# Patient Record
Sex: Female | Born: 1955 | Race: Black or African American | Hispanic: No | Smoking: Never smoker
Health system: Southern US, Community
[De-identification: ages and names within clinical notes are randomized; demographics above are authoritative.]

## PROBLEM LIST (undated history)

## (undated) DIAGNOSIS — K219 Gastro-esophageal reflux disease without esophagitis: Secondary | ICD-10-CM

## (undated) DIAGNOSIS — Z860100 Personal history of colon polyps, unspecified: Secondary | ICD-10-CM

## (undated) HISTORY — DX: Personal history of colon polyps, unspecified: Z86.0100

## (undated) HISTORY — PX: UPPER GASTROINTESTINAL ENDOSCOPY: SHX188

## (undated) HISTORY — DX: Personal history of colonic polyps: Z86.010

## (undated) HISTORY — DX: Gastro-esophageal reflux disease without esophagitis: K21.9

## (undated) HISTORY — PX: COLONOSCOPY: SHX174

---

## 2021-11-03 ENCOUNTER — Other Ambulatory Visit: Payer: Self-pay

## 2021-11-03 ENCOUNTER — Ambulatory Visit (HOSPITAL_BASED_OUTPATIENT_CLINIC_OR_DEPARTMENT_OTHER): Payer: BC Managed Care – PPO | Admitting: Nurse Practitioner

## 2021-11-03 ENCOUNTER — Encounter (HOSPITAL_BASED_OUTPATIENT_CLINIC_OR_DEPARTMENT_OTHER): Payer: Self-pay | Admitting: Nurse Practitioner

## 2021-11-03 VITALS — BP 117/72 | HR 98 | Ht 66.0 in | Wt 179.0 lb

## 2021-11-03 DIAGNOSIS — Z1211 Encounter for screening for malignant neoplasm of colon: Secondary | ICD-10-CM

## 2021-11-03 DIAGNOSIS — Z23 Encounter for immunization: Secondary | ICD-10-CM | POA: Diagnosis not present

## 2021-11-03 DIAGNOSIS — R143 Flatulence: Secondary | ICD-10-CM | POA: Insufficient documentation

## 2021-11-03 DIAGNOSIS — Z1329 Encounter for screening for other suspected endocrine disorder: Secondary | ICD-10-CM | POA: Diagnosis not present

## 2021-11-03 DIAGNOSIS — Z13228 Encounter for screening for other metabolic disorders: Secondary | ICD-10-CM

## 2021-11-03 DIAGNOSIS — M8580 Other specified disorders of bone density and structure, unspecified site: Secondary | ICD-10-CM

## 2021-11-03 DIAGNOSIS — K219 Gastro-esophageal reflux disease without esophagitis: Secondary | ICD-10-CM | POA: Insufficient documentation

## 2021-11-03 DIAGNOSIS — Z130 Encounter for screening for diseases of the blood and blood-forming organs and certain disorders involving the immune mechanism: Secondary | ICD-10-CM

## 2021-11-03 DIAGNOSIS — Z780 Asymptomatic menopausal state: Secondary | ICD-10-CM

## 2021-11-03 DIAGNOSIS — Z1321 Encounter for screening for nutritional disorder: Secondary | ICD-10-CM

## 2021-11-03 DIAGNOSIS — Z13820 Encounter for screening for osteoporosis: Secondary | ICD-10-CM

## 2021-11-03 DIAGNOSIS — Z1231 Encounter for screening mammogram for malignant neoplasm of breast: Secondary | ICD-10-CM

## 2021-11-03 DIAGNOSIS — Z0000 Encounter for general adult medical examination without abnormal findings: Secondary | ICD-10-CM

## 2021-11-03 NOTE — Patient Instructions (Signed)
Thank you for choosing [place] at [facility] for your Primary Care needs. I am excited for the opportunity to partner with you to meet your health care goals. It was a pleasure meeting you today!  Recommendations from today's visit: We will let you know if there are any concerns with your labs that need addressed.  I have sent orders for the DEXA scan, the mammogram, and to the GI doctor for colonoscopy and evaluation for endoscopy.  I have added the stretches for you back in this packet.   Information on diet, exercise, and health maintenance recommendations are listed below. This is information to help you be sure you are on track for optimal health and monitoring.   Please look over this and let us know if you have any questions or if you have completed any of the health maintenance outside of [place] so that we can be sure your records are up to date.  ___________________________________________________________ About Me: I am an Adult-Geriatric Nurse Practitioner with a background in caring for patients for more than 20 years with a strong intensive care background. I provide primary care and sports medicine services to patients age 2 and older within this office. My education had a strong focus on caring for the older adult population, which I am passionate about. I am also the director of the APP Fellowship with [facility].   My desire is to provide you with the best service through preventive medicine and supportive care. I consider you a part of the medical team and value your input. I work diligently to ensure that you are heard and your needs are met in a safe and effective manner. I want you to feel comfortable with me as your provider and want you to know that your health concerns are important to me.  For your information, our office hours are: Monday, Tuesday, and Thursday 8:00 AM - 5:00 PM Wednesday and Friday 8:00 AM - 12:00 PM.   In my time away  from the office I am teaching new APP's within the system and am unavailable, but my partner, Dr. Burnard Bunting is in the office for emergent needs.   If you have questions or concerns, please call our office at [phone] or send [place] a MyChart message and we will respond as quickly as possible.  ____________________________________________________________ MyChart:  For all urgent or time sensitive needs we ask that you please call the office to avoid delays. Our number is [phone]. MyChart is not constantly monitored and due to the large volume of messages a day, replies may take up to 72 business hours.  MyChart Policy: MyChart allows for you to see your visit notes, after visit summary, provider recommendations, lab and tests results, make an appointment, request refills, and contact your provider or the office for non-urgent questions or concerns. Providers are seeing patients during normal business hours and do not have built in time to review MyChart messages.  We ask that you allow a minimum of 3 business days for responses to [employer]. For this reason, please do not send urgent requests through [place]. Please call the office at [phone]. New and ongoing conditions may require a visit. We have virtual and in person visit available for your convenience.  Complex MyChart concerns may require a visit. Your provider may request you schedule a virtual or in person visit to ensure we are providing the best care possible. MyChart messages sent after 11:00 AM on Friday will not be  received by the provider until Monday morning.    Lab and Test Results: You will receive your lab and test results on MyChart as soon as they are completed and results have been sent by the lab or testing facility. Due to this service, you will receive your results BEFORE your provider.  I review lab and tests results each morning prior to seeing patients. Some results require collaboration with other providers  to ensure you are receiving the most appropriate care. For this reason, we ask that you please allow a minimum of 3-5 business days from the time the ALL results have been received for your provider to receive and review lab and test results and contact you about these.  Most lab and test result comments from the provider will be sent through [place]. Your provider may recommend changes to the plan of care, follow-up visits, repeat testing, ask questions, or request an office visit to discuss these results. You may reply directly to this message or call the office at [phone] to provide information for the provider or set up an appointment. In some instances, you will be called with test results and recommendations. Please let us know if this is preferred and we will make note of this in your chart to provide this for you.    If you have not heard a response to your lab or test results in 5 business days from all results returning to [place], please call the office to let us know. We ask that you please avoid calling prior to this time unless there is an emergent concern. Due to high call volumes, this can delay the resulting process.  After Hours: For all non-emergency after hours needs, please call the office at [phone] and select the option to reach the on-call provider service. On-call services are shared between multiple [facility] offices and therefore it will not be possible to speak directly with your provider. On-call providers may provide medical advice and recommendations, but are unable to provide refills for maintenance medications.  For all emergency or urgent medical needs after normal business hours, we recommend that you seek care at the closest Urgent Care or Emergency Department to ensure appropriate treatment in a timely manner.  [facility] has a 24 hour emergency room located on the ground floor for your convenience.   Urgent Concerns During the  Business Day Providers are seeing patients from 8AM to [place] with a busy schedule and are most often not able to respond to non-urgent calls until the end of the day or the next business day. If you should have URGENT concerns during the day, please call and speak to the nurse or schedule a same day appointment so that we can address your concern without delay.   Thank you, again, for choosing me as your health care partner. I appreciate your trust and look forward to learning more about you.   Worthy Keeler, DNP, AGNP-c ___________________________________________________________  Health Maintenance Recommendations Screening Testing Mammogram Every 1 -2 years based on history and risk factors Starting at age 59 Pap Smear Ages 21-39 every 3 years Ages 71-65 every 5 years with HPV testing More frequent testing may be required based on results and history Colon Cancer Screening Every 1-10 years based on test performed, risk factors, and history Starting at age 49 Bone Density Screening Every 2-10 years based on history Starting at age 66 for women Recommendations for men differ based on medication usage, history, and risk factors AAA  Screening One time ultrasound Men 74-34 years old who have every smoked Lung Cancer Screening Low Dose Lung CT every 12 months Age 60-80 years with a 30 pack-year smoking history who still smoke or who have quit within the last 15 years  Screening Labs Routine  Labs: Complete Blood Count (CBC), Complete Metabolic Panel (CMP), Cholesterol (Lipid Panel) Every 6-12 months based on history and medications May be recommended more frequently based on current conditions or previous results Hemoglobin A1c Lab Every 3-12 months based on history and previous results Starting at age 10 or earlier with diagnosis of diabetes, high cholesterol, BMI >26, and/or risk factors Frequent monitoring for patients with diabetes to ensure blood sugar control Thyroid Panel  (TSH w/ T3 & T4) Every 6 months based on history, symptoms, and risk factors May be repeated more often if on medication HIV One time testing for all patients 86 and older May be repeated more frequently for patients with increased risk factors or exposure Hepatitis C One time testing for all patients 65 and older May be repeated more frequently for patients with increased risk factors or exposure Gonorrhea, Chlamydia Every 12 months for all sexually active persons 13-24 years Additional monitoring may be recommended for those who are considered high risk or who have symptoms PSA Men 46-53 years old with risk factors Additional screening may be recommended from age 79-69 based on risk factors, symptoms, and history  Vaccine Recommendations Tetanus Booster All adults every 10 years Flu Vaccine All patients 6 months and older every year COVID Vaccine All patients 12 years and older Initial dosing with booster May recommend additional booster based on age and health history HPV Vaccine 2 doses all patients age 13-26 Dosing may be considered for patients over 26 Shingles Vaccine (Shingrix) 2 doses all adults 18 years and older Pneumonia (Pneumovax 23) All adults 73 years and older May recommend earlier dosing based on health history Pneumonia (Prevnar 26) All adults 67 years and older Dosed 1 year after Pneumovax 23  Additional Screening, Testing, and Vaccinations may be recommended on an individualized basis based on family history, health history, risk factors, and/or exposure.  __________________________________________________________  Diet Recommendations for All Patients  I recommend that all patients maintain a diet low in saturated fats, carbohydrates, and cholesterol. While this can be challenging at first, it is not impossible and small changes can make big differences.  Things to try: Decreasing the amount of soda, sweet tea, and/or juice to one or less per day and  replace with water While water is always the first choice, if you do not like water you may consider adding a water additive without sugar to improve the taste other sugar free drinks Replace potatoes with a brightly colored vegetable at dinner Use healthy oils, such as canola oil or olive oil, instead of butter or hard margarine Limit your bread intake to two pieces or less a day Replace regular pasta with low carb pasta options Bake, broil, or grill foods instead of frying Monitor portion sizes  Eat smaller, more frequent meals throughout the day instead of large meals  An important thing to remember is, if you love foods that are not great for your health, you don't have to give them up completely. Instead, allow these foods to be a reward when you have done well. Allowing yourself to still have special treats every once in a while is a nice way to tell yourself thank you for working hard to keep yourself healthy.  Also remember that every day is a new day. If you have a bad day and "fall off the wagon", you can still climb right back up and keep moving along on your journey!  We have resources available to help you!  Some websites that may be helpful include: www.http://carter.biz/  Www.VeryWellFit.com _____________________________________________________________  Activity Recommendations for All Patients  I recommend that all adults get at least 20 minutes of moderate physical activity that elevates your heart rate at least 5 days out of the week.  Some examples include: Walking or jogging at a pace that allows you to carry on a conversation Cycling (stationary bike or outdoors) Water aerobics Yoga Weight lifting Dancing If physical limitations prevent you from putting stress on your joints, exercise in a pool or seated in a chair are excellent options.  Do determine your MAXIMUM heart rate for activity: YOUR AGE - 220 = MAX HeartRate   Remember! Do not push yourself too hard.   Start slowly and build up your pace, speed, weight, time in exercise, etc.  Allow your body to rest between exercise and get good sleep. You will need more water than normal when you are exerting yourself. Do not wait until you are thirsty to drink. Drink with a purpose of getting in at least 8, 8 ounce glasses of water a day plus more depending on how much you exercise and sweat.    If you begin to develop dizziness, chest pain, abdominal pain, jaw pain, shortness of breath, headache, vision changes, lightheadedness, or other concerning symptoms, stop the activity and allow your body to rest. If your symptoms are severe, seek emergency evaluation immediately. If your symptoms are concerning, but not severe, please let us know so that we can recommend further evaluation.

## 2021-11-03 NOTE — Assessment & Plan Note (Signed)
Etiology unknown. She is due for colonoscopy.  Will send referral to GI.  Suspect that increased GERD may be contributing.  Discussion on diet and handout provided.  No alarm symptoms present.

## 2021-11-03 NOTE — Assessment & Plan Note (Signed)
Will send referral to GI for evaluation. She is in need of colonoscopy and would like to get an endoscopy at the same time, if possible.  Recommendations for diet and food elimination discussed and provided on handout.  She will let me know if natural options are not effective.

## 2021-11-03 NOTE — Progress Notes (Signed)
Orma Render, DNP, AGNP-c Primary Care & Sports Medicine [address]   [place], [place] [address] [phone]  New patient visit   Patient: Tina Harris   DOB: January 28, 1956   65 y.o. Female  MRN: [id number] Visit Date: 11/03/2021  Patient Care Team: Etola Mull, Coralee Pesa, NP as PCP - General (Nurse Practitioner)  Today's healthcare provider: Orma Render, NP   Chief Complaint  Patient presents with   New Patient (Initial Visit)    Patient presents to establish care. She is new to the area. She moved from [place]. Patient stated she would like labs done today. Her concern is GERD, she has been very gassy the past two months. She would also like to have a mammogram and colonoscopy as well. Patient denied flu shot. She would like tetanus shot today.    Subjective    HPI HPI     New Patient (Initial Visit)    Additional comments: Patient presents to establish care. She is new to the area. She moved from [place]. Patient stated she would like labs done today. Her concern is GERD, she has been very gassy the past two months. She would also like to have a mammogram and colonoscopy as well. Patient denied flu shot. She would like tetanus shot today.       Last edited by Pennelope Bracken on 11/03/2021  1:32 PM.      Niquita Digioia is a 65 y.o. female who presents today as a new patient to establish care.    Jesseka has concerns today with increased reflux and gas symptoms that have been present and she feels are worsening. She tells me in the past she did take Nexium for an extended period and she feels this has contributed to her bone density loss. She would like to avoid medications if at all possible. She reports that she does tend to eat in the evenings. She avoids carbonation. She does enjoy chocolate.  She reports her BM are soft and formed with no concerns for dark stool.  She denies noticeable bloating. She eats a healthy diet with  no dairy and high in fruits and vegetables. She drinks at least 64 ounces of water a day. She is active walking at least 2 miles a day.   She also reports intermittent pain in her lower left back only when she has been active. She reports the pain is dull and comes on quickly after activity then slowly resolves. She has no urinary symptoms. No fever, chills. No known injury to the area.   She moved to [place] to be closer to family in Apr 17, 2023. Her husband passed away in 17-Oct-2020 from [place].   History reviewed. No pertinent past medical history. History reviewed. No pertinent surgical history. Family Status  Relation Name Status   Mother  Alive   Father  Alive   History reviewed. No pertinent family history. Social History   Socioeconomic History   Marital status: Widowed    Spouse name: Not on file   Number of children: Not on file   Years of education: Not on file   Highest education level: Not on file  Occupational History   Not on file  Tobacco Use   Smoking status: Never   Smokeless tobacco: Never  Vaping Use   Vaping Use: Not on file  Substance and Sexual Activity   Alcohol use: Yes    Comment: rarely   Drug use: Never  Sexual activity: Not Currently  Other Topics Concern   Not on file  Social History Narrative   Not on file   Social Determinants of Health   Financial Resource Strain: Not on file  Food Insecurity: Not on file  Transportation Needs: Not on file  Physical Activity: Not on file  Stress: Not on file  Social Connections: Not on file   No outpatient medications prior to visit.   No facility-administered medications prior to visit.   No Known Allergies  Immunization History  Administered Date(s) Administered   Td 11/03/2021    Health Maintenance  Topic Date Due   HIV Screening  Never done   Hepatitis C Screening  Never done   PAP SMEAR-Modifier  Never done   COLONOSCOPY (Pts 45-27yrs Insurance coverage will need to be confirmed)   Never done   MAMMOGRAM  Never done   Zoster Vaccines- Shingrix (1 of 2) Never done   INFLUENZA VACCINE  11/03/2022 (Originally 06/13/2021)   TETANUS/TDAP  11/04/2031   Pneumococcal Vaccine 76-65 Years old  Aged Out   HPV VACCINES  Aged Out    Patient Care Team: Sloan Galentine, Coralee Pesa, NP as PCP - General (Nurse Practitioner)  Review of Systems All review of systems negative except what is listed in the HPI   Objective    BP 117/72    Pulse 98    Ht 5\' 6"  (1.676 m)    Wt 179 lb (81.2 kg)    SpO2 96%    BMI 28.89 kg/m  Physical Exam Vitals and nursing note reviewed.  Constitutional:      General: She is not in acute distress.    Appearance: Normal appearance.  Eyes:     Extraocular Movements: Extraocular movements intact.     Conjunctiva/sclera: Conjunctivae normal.     Pupils: Pupils are equal, round, and reactive to light.  Neck:     Vascular: No carotid bruit.  Cardiovascular:     Rate and Rhythm: Normal rate and regular rhythm.     Pulses: Normal pulses.     Heart sounds: Normal heart sounds. No murmur heard. Pulmonary:     Effort: Pulmonary effort is normal.     Breath sounds: Normal breath sounds. No wheezing.  Abdominal:     General: Bowel sounds are normal.     Palpations: Abdomen is soft.  Musculoskeletal:        General: Normal range of motion.     Cervical back: Normal range of motion.     Right lower leg: No edema.     Left lower leg: No edema.  Skin:    General: Skin is warm and dry.     Capillary Refill: Capillary refill takes less than 2 seconds.  Neurological:     General: No focal deficit present.     Mental Status: She is alert and oriented to person, place, and time.  Psychiatric:        Mood and Affect: Mood normal.        Behavior: Behavior normal.        Thought Content: Thought content normal.        Judgment: Judgment normal.    Depression Screen PHQ 2/9 Scores 11/03/2021  PHQ - 2 Score 0   No results found for any visits on 11/03/21.   Assessment & Plan      Problem List Items Addressed This Visit     Gastroesophageal reflux disease    Will send referral to GI for  evaluation. She is in need of colonoscopy and would like to get an endoscopy at the same time, if possible.  Recommendations for diet and food elimination discussed and provided on handout.  She will let me know if natural options are not effective.       Relevant Orders   CBC with Differential/Platelet   Comprehensive metabolic panel   Lipid panel   Hemoglobin A1c   TSH   VITAMIN D 25 Hydroxy (Vit-D Deficiency, Fractures)   T4   T3   Ambulatory referral to Gastroenterology   Excessive flatus    Etiology unknown. She is due for colonoscopy.  Will send referral to GI.  Suspect that increased GERD may be contributing.  Discussion on diet and handout provided.  No alarm symptoms present.       Relevant Orders   Ambulatory referral to Gastroenterology   Other Visit Diagnoses     Encounter for immunization    -  Primary   Relevant Orders   Td : Tetanus/diphtheria >7yo Preservative  free (Completed)   Encounter for medical examination to establish care       Screening for endocrine, nutritional, metabolic and immunity disorder       Relevant Orders   CBC with Differential/Platelet   Comprehensive metabolic panel   Lipid panel   Hemoglobin A1c   TSH   VITAMIN D 25 Hydroxy (Vit-D Deficiency, Fractures)   T4   T3   Screening mammogram for breast cancer       Relevant Orders   MM Digital Screening   Screening for osteoporosis       Relevant Orders   DG Bone Density   Screening for colon cancer       Relevant Orders   Ambulatory referral to Gastroenterology   Osteopenia after menopause       Relevant Orders   DG Bone Density        In next few months for CPE     Laken Rog, Coralee Pesa, NP, DNP, AGNP-C Primary Care & Sports Medicine at [facility]

## 2021-11-04 LAB — COMPREHENSIVE METABOLIC PANEL
ALT: 11 IU/L (ref 0–32)
AST: 20 IU/L (ref 0–40)
Albumin/Globulin Ratio: 1.7 (ref 1.2–2.2)
Albumin: 4.8 g/dL (ref 3.8–4.8)
Alkaline Phosphatase: 86 IU/L (ref 44–121)
BUN/Creatinine Ratio: 11 — ABNORMAL LOW (ref 12–28)
BUN: 10 mg/dL (ref 8–27)
Bilirubin Total: 0.3 mg/dL (ref 0.0–1.2)
CO2: 24 mmol/L (ref 20–29)
Calcium: 10 mg/dL (ref 8.7–10.3)
Chloride: 104 mmol/L (ref 96–106)
Creatinine, Ser: 0.89 mg/dL (ref 0.57–1.00)
Globulin, Total: 2.8 g/dL (ref 1.5–4.5)
Glucose: 83 mg/dL (ref 70–99)
Potassium: 4 mmol/L (ref 3.5–5.2)
Sodium: 143 mmol/L (ref 134–144)
Total Protein: 7.6 g/dL (ref 6.0–8.5)
eGFR: 72 mL/min/{1.73_m2} (ref >59)

## 2021-11-04 LAB — CBC WITH DIFFERENTIAL/PLATELET
Basophils Absolute: 0 10*3/uL (ref 0.0–0.2)
Basos: 1 %
EOS (ABSOLUTE): 0.1 10*3/uL (ref 0.0–0.4)
Eos: 2 %
Hematocrit: 38.5 % (ref 34.0–46.6)
Hemoglobin: 12.7 g/dL (ref 11.1–15.9)
Immature Grans (Abs): 0 10*3/uL (ref 0.0–0.1)
Immature Granulocytes: 0 %
Lymphocytes Absolute: 1.6 10*3/uL (ref 0.7–3.1)
Lymphs: 47 %
MCH: 27.5 pg (ref 26.6–33.0)
MCHC: 33 g/dL (ref 31.5–35.7)
MCV: 84 fL (ref 79–97)
Monocytes Absolute: 0.3 10*3/uL (ref 0.1–0.9)
Monocytes: 10 %
Neutrophils Absolute: 1.4 10*3/uL (ref 1.4–7.0)
Neutrophils: 40 %
Platelets: 315 10*3/uL (ref 150–450)
RBC: 4.61 x10E6/uL (ref 3.77–5.28)
RDW: 12.4 % (ref 11.7–15.4)
WBC: 3.4 10*3/uL (ref 3.4–10.8)

## 2021-11-04 LAB — LIPID PANEL
Chol/HDL Ratio: 2.8 ratio (ref 0.0–4.4)
Cholesterol, Total: 235 mg/dL — ABNORMAL HIGH (ref 100–199)
HDL: 83 mg/dL (ref >39)
LDL Chol Calc (NIH): 136 mg/dL — ABNORMAL HIGH (ref 0–99)
Triglycerides: 95 mg/dL (ref 0–149)
VLDL Cholesterol Cal: 16 mg/dL (ref 5–40)

## 2021-11-04 LAB — T3: T3, Total: 82 ng/dL (ref 71–180)

## 2021-11-04 LAB — HEMOGLOBIN A1C
Est. average glucose Bld gHb Est-mCnc: 108 mg/dL
Hgb A1c MFr Bld: 5.4 % (ref 4.8–5.6)

## 2021-11-04 LAB — VITAMIN D 25 HYDROXY (VIT D DEFICIENCY, FRACTURES): Vit D, 25-Hydroxy: 67.1 ng/mL (ref 30.0–100.0)

## 2021-11-04 LAB — T4: T4, Total: 6.5 ug/dL (ref 4.5–12.0)

## 2021-11-04 LAB — TSH: TSH: 2 u[IU]/mL (ref 0.450–4.500)

## 2021-12-09 ENCOUNTER — Ambulatory Visit: Payer: BC Managed Care – PPO

## 2021-12-16 ENCOUNTER — Ambulatory Visit
Admission: RE | Admit: 2021-12-16 | Discharge: 2021-12-16 | Disposition: A | Discharge status: Home / Self Care | Payer: BC Managed Care – PPO | Source: Ambulatory Visit | Attending: Nurse Practitioner | Admitting: Nurse Practitioner

## 2021-12-16 ENCOUNTER — Ambulatory Visit: Payer: BC Managed Care – PPO

## 2021-12-16 ENCOUNTER — Other Ambulatory Visit: Payer: Self-pay

## 2021-12-16 DIAGNOSIS — Z1231 Encounter for screening mammogram for malignant neoplasm of breast: Secondary | ICD-10-CM

## 2021-12-28 ENCOUNTER — Telehealth (HOSPITAL_BASED_OUTPATIENT_CLINIC_OR_DEPARTMENT_OTHER): Payer: Self-pay

## 2021-12-28 DIAGNOSIS — K219 Gastro-esophageal reflux disease without esophagitis: Secondary | ICD-10-CM

## 2021-12-28 NOTE — Telephone Encounter (Signed)
Spoke with patient regarding mammogram results. She then inquired regarding referral to [facility]. She never heard anything so I sent a new referral for her to [facility]

## 2022-01-06 ENCOUNTER — Encounter: Payer: Self-pay | Admitting: Internal Medicine

## 2022-01-10 ENCOUNTER — Ambulatory Visit
Admission: RE | Admit: 2022-01-10 | Discharge: 2022-01-10 | Disposition: A | Discharge status: Home / Self Care | Payer: BC Managed Care – PPO | Source: Ambulatory Visit | Attending: Nurse Practitioner | Admitting: Nurse Practitioner

## 2022-01-10 DIAGNOSIS — Z13820 Encounter for screening for osteoporosis: Secondary | ICD-10-CM

## 2022-01-10 DIAGNOSIS — M8580 Other specified disorders of bone density and structure, unspecified site: Secondary | ICD-10-CM

## 2022-01-13 ENCOUNTER — Encounter: Payer: Self-pay | Admitting: Internal Medicine

## 2022-01-13 ENCOUNTER — Ambulatory Visit (INDEPENDENT_AMBULATORY_CARE_PROVIDER_SITE_OTHER): Payer: BC Managed Care – PPO | Admitting: Internal Medicine

## 2022-01-13 VITALS — BP 116/64 | HR 69 | Ht 64.5 in | Wt 177.8 lb

## 2022-01-13 DIAGNOSIS — K219 Gastro-esophageal reflux disease without esophagitis: Secondary | ICD-10-CM | POA: Diagnosis not present

## 2022-01-13 DIAGNOSIS — Z1211 Encounter for screening for malignant neoplasm of colon: Secondary | ICD-10-CM

## 2022-01-13 MED ORDER — SUTAB 1479-225-188 MG PO TABS: 1.0000 | ORAL_TABLET | Freq: Once | ORAL | 0 refills | Status: AC

## 2022-01-13 NOTE — Patient Instructions (Addendum)
If you are age 65 or older, your body mass index should be between 23-30. Your Body mass index is 30.05 kg/m?Marland Kitchen If this is out of the aforementioned range listed, please consider follow up with your Primary Care Provider. ? ?If you are age 15 or younger, your body mass index should be between 19-25. Your Body mass index is 30.05 kg/m?Marland Kitchen If this is out of the aformentioned range listed, please consider follow up with your Primary Care Provider.  ? ?Please follow GERD diet given. ? ?We have sent a referral to Nutrition and they will call you to make an appointment.  ? ?You have been scheduled for an endoscopy and colonoscopy. Please follow the written instructions given to you at your visit today. ?Please pick up your prep supplies at the pharmacy within the next 1-3 days. ?If you use inhalers (even only as needed), please bring them with you on the day of your procedure.  ? ?The [facility] providers would like to encourage you to use [facility] to communicate with providers for non-urgent requests or questions.  Due to long hold times on the telephone, sending your provider a message by [facility] may be a faster and more efficient way to get a response.  Please allow 48 business hours for a response.  Please remember that this is for non-urgent requests.  ? ?It was a pleasure to see you today! ? ?Thank you for trusting me with your gastrointestinal care!   ? ?Christia Reading , MD  ? ? ? ?

## 2022-01-13 NOTE — Progress Notes (Signed)
? ?Chief Complaint: GERD, colon polyps ? ?HPI : 66 year old female with history of GERD and colon polyps presents with GERD and colon polyps ? ?She has issues with chest burning and regurgitation. She has had GERD for years, which worsened when she was in her 68s. She was on Nexium for a long time when she was told that she had esophagitis seen on EGD.  She has since stopped her Nexium because she has had some concerns about side effects of taking medications.  Most of her current GERD symptoms have been occurring in the daytime. She does not like to take medications so she would like to try conservative measures to prevent the reflux from happening in the first place. She does not like taking Tums, Maalox, or Mylanta. She has tried Pepcid in the past. She has had a lot of stressors over the last few years such as losing her husband, dealing with COVID, and retiring from her job. She also is in the middle of buying a house, which has also been stressful. She does struggle with following an acid reflux friendly diet. She loves eating chocolate turtles and lemons, which she knows will flare up her acid reflux. She bought a sleep number bed but she cannot tolerate sleeping at an incline. Last colonoscopy was in 2014-2015 when she was told that she had a colon polyp. Last EGD was in 2013-2015 with esophagitis. Denies blood in stools, weight loss, diarrhea, constipation, dysphagia. Denies fam hx of colon cancer. Endorses fam hx of colon polyps in father. ? ?Past Medical History:  ?Diagnosis Date  ? GERD (gastroesophageal reflux disease)   ? History of colon polyps   ? ?Past Surgical History:  ?Procedure Laterality Date  ? COLONOSCOPY    ? 2014-2015  ? ?Family History  ?Problem Relation Age of Onset  ? Prostate cancer Father   ? ?Social History  ? ?Tobacco Use  ? Smoking status: Never  ? Smokeless tobacco: Never  ?Substance Use Topics  ? Alcohol use: Yes  ?  Comment: rarely  ? Drug use: Never  ? ?No current outpatient  medications on file.  ? ?No current facility-administered medications for this visit.  ? ?No Known Allergies ? ?Review of Systems: ?All systems reviewed and negative except where noted in HPI.  ? ?Physical Exam: ?BP 116/64   Pulse 69   Ht 5' 4.5" (1.638 m)   Wt 177 lb 12.8 oz (80.6 kg)   BMI 30.05 kg/m?  ?Constitutional: Pleasant,well-developed, female in no acute distress. ?HEENT: Normocephalic and atraumatic. Conjunctivae are normal. No scleral icterus. ?Cardiovascular: Normal rate, regular rhythm.  ?Pulmonary/chest: Effort normal and breath sounds normal. No wheezing, rales or rhonchi. ?Abdominal: Soft, nondistended, nontender. Bowel sounds active throughout. There are no masses palpable. No hepatomegaly. ?Extremities: No edema ?Neurological: Alert and oriented to person place and time. ?Skin: Skin is warm and dry. No rashes noted. ?Psychiatric: Normal mood and affect. Behavior is normal. ? ?Labs 10/2021: CBC and CMP unremarkable. TSH nml. ? ?ASSESSMENT AND PLAN: ? ?GERD ?History of colon polyps ?Patient presents with longstanding issues with GERD, and would prefer to be able to control her reflux without using medications if possible.  Will give her a handout on suggestions for how to improve her reflux control.  Also encouraged her to lose weight if possible.  Will refer her to a nutritionist in order to get extra guidance on how to lose weight and follow a reflux friendly diet.  Since patient is over the  age of 66 and has had worsening of her reflux control recently, we will plan for EGD for further evaluation.  Patient also has history of reported colon polyps, unclear what kind.  We will plan for a colonoscopy for further evaluation since her last colonoscopy was about 8-9 years ago. ?- GERD hand out ?- Encourage weight loss ?- Nutritionist referral for weight loss and GERD friendly diet ?- EGD/Colonoscopy [facility]. Will plan for Sutab bowel prep ? ?Christia Reading, MD ? ?

## 2022-01-18 ENCOUNTER — Other Ambulatory Visit: Payer: Self-pay

## 2022-01-18 ENCOUNTER — Encounter: Payer: BC Managed Care – PPO | Attending: Internal Medicine | Admitting: Dietician

## 2022-01-18 ENCOUNTER — Encounter: Payer: Self-pay | Admitting: Dietician

## 2022-01-18 DIAGNOSIS — K219 Gastro-esophageal reflux disease without esophagitis: Secondary | ICD-10-CM | POA: Insufficient documentation

## 2022-01-18 NOTE — Progress Notes (Signed)
Medical Nutrition Therapy  ?Appointment Start time:  1400  Appointment End time:  1510 ? ?Primary concerns today: Reflux,   ?Referral diagnosis: K21.9 - GERD ?Preferred learning style: No preference indicated ?Learning readiness: Contemplating ? ? ?NUTRITION ASSESSMENT  ? ?Anthropometrics  ?None taken  ? ?Clinical ?Medical Hx: GERD, Low bone density ?Medications: N/A ?Labs: TC - 235, LDL - 136 ?Notable Signs/Symptoms: Consistent reflux ? ?Lifestyle & Dietary Hx ?Pt reports feeling overwhelmed with their current health problems and needing to schedule a lot of tests in the past few months (colonoscopy, endoscopy, mammogram). ?Pt reports history of reflux, took Nexium with no improvement in symptoms. Pt reports wanting to avoid starting any medication, would like to control reflux through diet and exercise. Pt states that they do well with structure. ?Pt is retired, states they are highly stressed at the moment. Stress comes mostly from their living situation and pt states that it is difficult to find the motivation to get up and move when stressed. Pt will be moving in the future, but not sure when their house will be finished. ?Pt reports inconsistent meal timing, may not eat for a few hours after waking and will snack on junk foods (cookies, turtles, chocolates) late at night. ?Pt reports drinking 48-64 oz of water daily. ? ?Estimated daily fluid intake: 76 oz ?Supplements: Daily Supplement pack (Vitamins, minerals, etc.) ?Sleep: Sleeps well, doesn't get waken up ?Stress / self-care: 8/10, life transitions over the past 3 years. ?Current average weekly physical activity: ADLs, reports being sedentary ? ?24-Hr Dietary Recall ?First Meal: Fruit bowl with raspberries, black berries, honeydew ?Snack: none ?Second Meal: Tortilla chips, black beans, chili, cheese, salsa ?Snack: [place]man cookies ?Third Meal: none ?Snack: none ?Beverages: water ? ? ?NUTRITION DIAGNOSIS  ?NB-1.1 Food and nutrition-related knowledge deficit  As related to GERD.  As evidenced by high stress level, inconsistent meal timing, and late night snacking on high fat foods. ? ? ?NUTRITION INTERVENTION  ?Nutrition education (E-1) on the following topics:  ?Educated pt on potential food sources (high fat foods,spicy foods, high refined sugar foods, caffeine, etc.) that can trigger reflux and/or other GERD symptoms including chest pain. Educated pt to avoid eating within 2-3 hours of going to bed to minimize reflux. Advised pt to sleep with head elevated to decrease reflux. Educated pt on the potential health complications that can occur related to uncontrolled GERD, including stomach ulcers and esophageal erosion/cancer.  ?Educate pt on factors that can elevate LDL cholesterol, including high dietary intake of saturated fats. Educate pt on identifying sources of saturated fats, and how to make alternative food choices to lower saturated fat intake.  ? ?Handouts Provided Include  ?GERD Nutrition Therapy ?Proteins food list ?Fats food list ? ?Learning Style & Readiness for Change ?Teaching method utilized: Visual & Auditory  ?Demonstrated degree of understanding via: Teach Back  ?Barriers to learning/adherence to lifestyle change: Stress ? ?Goals Established by Pt ?Begin to take a walk in the early afternoon 3 days a week after having a lunch time meal. ?Begin to try to eat something within an hour of waking up and having small, consistent meals throughout the day. ?Consider adding Fairlife brand fat-free milk during the day for a high protein, low sugar, high calcium beverage. ?Choose "Baked" chips to reduce your fat intake when snacking. ?Use a combination of these three strategies to effectively lower your consumption of fatty foods ?1) Consume a smaller portion when having a high fat food ?2) Consume fatty foods less frequently ?  3) Find a reduced, low, or fat-free version. ? ? ?MONITORING & EVALUATION ?Dietary intake, weekly physical activity, and symptoms of  reflux in 6 weeks. ? ?Next Steps  ?Patient is to follow up with RD after endoscopy and colonoscopy. ? ?

## 2022-01-18 NOTE — Patient Instructions (Addendum)
Begin to take a walk in the early afternoon 3 days a week after having a lunch time meal. ? ?Begin to try to eat something within an hour of waking up and having small, consistent meals throughout the day. ? ?Consider adding Fairlife brand fat-free milk during the day for a high protein, low sugar, high calcium beverage. ? ?Choose "Baked" chips to reduce your fat intake when snacking. ? ?Use a combination of these three strategies to effectively lower your consumption of fatty foods ?1) Consume a smaller portion when having a high fat food ?2) Consume fatty foods less frequently ?3) Find a reduced, low, or fat-free version. ? ? ? ? ?

## 2022-01-19 ENCOUNTER — Other Ambulatory Visit (HOSPITAL_BASED_OUTPATIENT_CLINIC_OR_DEPARTMENT_OTHER): Payer: Self-pay | Admitting: Nurse Practitioner

## 2022-01-19 DIAGNOSIS — M810 Age-related osteoporosis without current pathological fracture: Secondary | ICD-10-CM

## 2022-01-20 ENCOUNTER — Encounter (HOSPITAL_BASED_OUTPATIENT_CLINIC_OR_DEPARTMENT_OTHER): Payer: Self-pay | Admitting: Nurse Practitioner

## 2022-01-24 ENCOUNTER — Telehealth (HOSPITAL_BASED_OUTPATIENT_CLINIC_OR_DEPARTMENT_OTHER): Payer: Self-pay

## 2022-01-24 ENCOUNTER — Other Ambulatory Visit (HOSPITAL_BASED_OUTPATIENT_CLINIC_OR_DEPARTMENT_OTHER): Payer: Self-pay | Admitting: Nurse Practitioner

## 2022-01-24 DIAGNOSIS — M810 Age-related osteoporosis without current pathological fracture: Secondary | ICD-10-CM

## 2022-01-24 MED ORDER — CALCIUM CARB-CHOLECALCIFEROL 500-10 MG-MCG PO TABS: 2.0000 | ORAL_TABLET | Freq: Every day | ORAL | 11 refills | Status: DC

## 2022-02-22 ENCOUNTER — Ambulatory Visit (AMBULATORY_SURGERY_CENTER): Payer: BC Managed Care – PPO | Admitting: Internal Medicine

## 2022-02-22 ENCOUNTER — Encounter: Payer: Self-pay | Admitting: Internal Medicine

## 2022-02-22 VITALS — BP 110/55 | HR 59 | Temp 97.5°F | Resp 14 | Ht 64.0 in | Wt 177.0 lb

## 2022-02-22 DIAGNOSIS — K635 Polyp of colon: Secondary | ICD-10-CM

## 2022-02-22 DIAGNOSIS — Z1211 Encounter for screening for malignant neoplasm of colon: Secondary | ICD-10-CM

## 2022-02-22 DIAGNOSIS — K2100 Gastro-esophageal reflux disease with esophagitis, without bleeding: Secondary | ICD-10-CM

## 2022-02-22 DIAGNOSIS — K219 Gastro-esophageal reflux disease without esophagitis: Secondary | ICD-10-CM

## 2022-02-22 DIAGNOSIS — D125 Benign neoplasm of sigmoid colon: Secondary | ICD-10-CM

## 2022-02-22 MED ORDER — SODIUM CHLORIDE 0.9 % IV SOLN: 500.0000 mL | Freq: Once | INTRAVENOUS | Status: DC

## 2022-02-22 MED ORDER — PANTOPRAZOLE SODIUM 40 MG PO TBEC: 40.0000 mg | DELAYED_RELEASE_TABLET | Freq: Two times a day (BID) | ORAL | 0 refills | Status: DC

## 2022-02-22 NOTE — Progress Notes (Signed)
Occ pvc'S NOTED ?

## 2022-02-22 NOTE — Patient Instructions (Addendum)
Handouts on GERD, esophagitis, polyps, and diverticulosis. ? ?A prescription for protonix '40mg'$ , twice daily for 8 weeks has been sent to your pharmacy. ? ?No ibuprofen, naproxen, or other non-steroidal anti-inflammatory drugs. ? ?Await pathology results. ? ?YOU HAD AN ENDOSCOPIC PROCEDURE TODAY AT [facility]:   Refer to the procedure report that was given to you for any specific questions about what was found during the examination.  If the procedure report does not answer your questions, please call your gastroenterologist to clarify.  If you requested that your care partner not be given the details of your procedure findings, then the procedure report has been included in a sealed envelope for you to review at your convenience later. ? ?YOU SHOULD EXPECT: Some feelings of bloating in the abdomen. Passage of more gas than usual.  Walking can help get rid of the air that was put into your GI tract during the procedure and reduce the bloating. If you had a lower endoscopy (such as a colonoscopy or flexible sigmoidoscopy) you may notice spotting of blood in your stool or on the toilet paper. If you underwent a bowel prep for your procedure, you may not have a normal bowel movement for a few days. ? ?Please Note:  You might notice some irritation and congestion in your nose or some drainage.  This is from the oxygen used during your procedure.  There is no need for concern and it should clear up in a day or so. ? ?SYMPTOMS TO REPORT IMMEDIATELY: ? ?Following lower endoscopy (colonoscopy or flexible sigmoidoscopy): ? Excessive amounts of blood in the stool ? Significant tenderness or worsening of abdominal pains ? Swelling of the abdomen that is new, acute ? Fever of 100?F or higher ? ?Following upper endoscopy (EGD) ? Vomiting of blood or coffee ground material ? New chest pain or pain under the shoulder blades ? Painful or persistently difficult swallowing ? New shortness of breath ? Fever of 100?F  or higher ? Black, tarry-looking stools ? ?For urgent or emergent issues, a gastroenterologist can be reached at any hour by calling [phone]. ?Do not use MyChart messaging for urgent concerns.  ? ? ?DIET:  We do recommend a small meal at first, but then you may proceed to your regular diet.  Drink plenty of fluids but you should avoid alcoholic beverages for 24 hours. ? ?ACTIVITY:  You should plan to take it easy for the rest of today and you should NOT DRIVE or use heavy machinery until tomorrow (because of the sedation medicines used during the test).   ? ?FOLLOW UP: ?Our staff will call the number listed on your records 48-72 hours following your procedure to check on you and address any questions or concerns that you may have regarding the information given to you following your procedure. If we do not reach you, we will leave a message.  We will attempt to reach you two times.  During this call, we will ask if you have developed any symptoms of COVID 19. If you develop any symptoms (ie: fever, flu-like symptoms, shortness of breath, cough etc.) before then, please call [phone].  If you test positive for Covid 19 in the 2 weeks post procedure, please call and report this information to [place].   ? ?If any biopsies were taken you will be contacted by phone or by letter within the next 1-3 weeks.  Please call us at [phone] if you have not heard about the biopsies in  3 weeks.  ? ? ?SIGNATURES/CONFIDENTIALITY: ?You and/or your care partner have signed paperwork which will be entered into your electronic medical record.  These signatures attest to the fact that that the information above on your After Visit Summary has been reviewed and is understood.  Full responsibility of the confidentiality of this discharge information lies with you and/or your care-partner.  ?

## 2022-02-22 NOTE — Op Note (Signed)
[place] ?Patient Name: Tina Harris ?Procedure Date: 02/22/2022 2:07 PM ?MRN: [id number] ?Endoscopist: Sonny Masters "Christia Reading ,  ?Age: 66 ?Referring MD:  ?Date of Birth: 29-Sep-1956 ?Gender: Female ?Account #: [id number] ?Procedure:                Colonoscopy ?Indications:              Screening for colorectal malignant neoplasm ?Medicines:                Monitored Anesthesia Care ?Procedure:                Pre-Anesthesia Assessment: ?                          - Prior to the procedure, a History and Physical  ?                          was performed, and patient medications and  ?                          allergies were reviewed. The patient's tolerance of  ?                          previous anesthesia was also reviewed. The risks  ?                          and benefits of the procedure and the sedation  ?                          options and risks were discussed with the patient.  ?                          All questions were answered, and informed consent  ?                          was obtained. Prior Anticoagulants: The patient has  ?                          taken no previous anticoagulant or antiplatelet  ?                          agents. ASA Grade Assessment: III - A patient with  ?                          severe systemic disease. After reviewing the risks  ?                          and benefits, the patient was deemed in  ?                          satisfactory condition to undergo the procedure. ?                          After obtaining informed consent, the colonoscope  ?  was passed under direct vision. Throughout the  ?                          procedure, the patient's blood pressure, pulse, and  ?                          oxygen saturations were monitored continuously. The  ?                          CF [id number] #[id number] was introduced through the anus  ?                          and advanced to the the terminal ileum. The  ?                          colonoscopy  was performed without difficulty. The  ?                          patient tolerated the procedure well. ?Scope In: 2:30:39 PM ?Scope Out: 2:47:33 PM ?Scope Withdrawal Time: 0 hours 9 minutes 57 seconds  ?Total Procedure Duration: 0 hours 16 minutes 54 seconds  ?Findings:                 The terminal ileum appeared normal. ?                          Multiple small and large-mouthed diverticula were  ?                          found in the ascending colon. ?                          A 3 mm polyp was found in the sigmoid colon. The  ?                          polyp was sessile. The polyp was removed with a  ?                          cold snare. Resection and retrieval were complete. ?                          Non-bleeding internal hemorrhoids were found during  ?                          retroflexion. ?Complications:            No immediate complications. ?Estimated Blood Loss:     Estimated blood loss was minimal. ?Impression:               - The examined portion of the ileum was normal. ?                          - Diverticulosis in the ascending colon. ?                          -  One 3 mm polyp in the sigmoid colon, removed with  ?                          a cold snare. Resected and retrieved. ?                          - Non-bleeding internal hemorrhoids. ?Recommendation:           - Discharge patient to home (with escort). ?                          - Await pathology results. ?                          - The findings and recommendations were discussed  ?                          with the patient. ?Georgian Co,  ?02/22/2022 2:54:21 PM ?

## 2022-02-22 NOTE — Progress Notes (Signed)
Pt non-responsive, VVS, Report to RN  °

## 2022-02-22 NOTE — Progress Notes (Signed)
Vitals-DT ? ?hISTORY REVIEWED ?

## 2022-02-22 NOTE — Op Note (Addendum)
[place] ?Patient Name: Tina Harris ?Procedure Date: 02/22/2022 2:18 PM ?MRN: [id number] ?Endoscopist: Sonny Masters "Christia Reading ,  ?Age: 66 ?Referring MD:  ?Date of Birth: 02-25-1956 ?Gender: Female ?Account #: [id number] ?Procedure:                Upper GI endoscopy ?Indications:              Heartburn ?Medicines:                Monitored Anesthesia Care ?Procedure:                Pre-Anesthesia Assessment: ?                          - Prior to the procedure, a History and Physical  ?                          was performed, and patient medications and  ?                          allergies were reviewed. The patient's tolerance of  ?                          previous anesthesia was also reviewed. The risks  ?                          and benefits of the procedure and the sedation  ?                          options and risks were discussed with the patient.  ?                          All questions were answered, and informed consent  ?                          was obtained. Prior Anticoagulants: The patient has  ?                          taken no previous anticoagulant or antiplatelet  ?                          agents. ASA Grade Assessment: II - A patient with  ?                          mild systemic disease. After reviewing the risks  ?                          and benefits, the patient was deemed in  ?                          satisfactory condition to undergo the procedure. ?                          After obtaining informed consent, the endoscope was  ?  passed under direct vision. Throughout the  ?                          procedure, the patient's blood pressure, pulse, and  ?                          oxygen saturations were monitored continuously. The  ?                          Endoscope was introduced through the mouth, and  ?                          advanced to the second part of duodenum. The upper  ?                          GI endoscopy was accomplished without  difficulty.  ?                          The patient tolerated the procedure well. ?Scope In: ?Scope Out: ?Findings:                 LA Grade B (one or more mucosal breaks greater than  ?                          5 mm, not extending between the tops of two mucosal  ?                          folds) esophagitis with no bleeding was found at  ?                          the gastroesophageal junction. Biopsies were taken  ?                          with a cold forceps for histology. ?                          One non-bleeding cratered gastric ulcer with  ?                          pigmented material was found in the gastric antrum.  ?                          The lesion was 7 mm in largest dimension. Biopsies  ?                          were taken with a cold forceps for histology. ?                          The examined duodenum was normal. ?Complications:            No immediate complications. ?Estimated Blood Loss:     Estimated blood loss was minimal. ?Impression:               - LA Grade B esophagitis with no bleeding. Biopsied. ?                          -  Non-bleeding gastric ulcer with pigmented  ?                          material. Biopsied. ?                          - Normal examined duodenum. ?Recommendation:           - Use Protonix (pantoprazole) 40 mg PO BID for 8  ?                          weeks. ?                          - No ibuprofen, naproxen, or other non-steroidal  ?                          anti-inflammatory drugs. ?                          - Repeat upper endoscopy in 8 weeks to check  ?                          healing. ?                          - Perform a colonoscopy today. ?Georgian Co,  ?02/22/2022 2:52:06 PM ?

## 2022-02-22 NOTE — Progress Notes (Signed)
? ?GASTROENTEROLOGY PROCEDURE H&P NOTE  ? ?Primary Care Physician: ?Orma Render, NP ? ? ? ?Reason for Procedure:   Worsening of GERD, colon cancer screening ? ?Plan:    EGD/colonoscopy ? ?Patient is appropriate for endoscopic procedure(s) in the ambulatory ([place]) setting. ? ?The nature of the procedure, as well as the risks, benefits, and alternatives were carefully and thoroughly reviewed with the patient. Ample time for discussion and questions allowed. The patient understood, was satisfied, and agreed to proceed.  ? ? ? ?HPI: ?Tina Harris is a 66 y.o. female who presents for EGD/colonoscopy for evaluation of worsening of GERD and colon cancer screening .  Patient was most recently seen in the Gastroenterology Clinic on 01/13/22.  No interval change in medical history since that appointment. Please refer to that note for full details regarding GI history and clinical presentation.  ? ?Past Medical History:  ?Diagnosis Date  ? GERD (gastroesophageal reflux disease)   ? History of colon polyps   ? ? ?Past Surgical History:  ?Procedure Laterality Date  ? COLONOSCOPY    ? 2014-2015  ? UPPER GASTROINTESTINAL ENDOSCOPY    ? ? ?Prior to Admission medications   ?Medication Sig Start Date End Date Taking? Authorizing Provider  ?Calcium Carb-Cholecalciferol (CALCIUM 500+D HIGH POTENCY) 500-10 MG-MCG TABS Take 2 tablets by mouth daily. 01/24/22   Orma Render, NP  ? ? ?Current Outpatient Medications  ?Medication Sig Dispense Refill  ? Calcium Carb-Cholecalciferol (CALCIUM 500+D HIGH POTENCY) 500-10 MG-MCG TABS Take 2 tablets by mouth daily. 60 tablet 11  ? ?Current Facility-Administered Medications  ?Medication Dose Route Frequency Provider Last Rate Last Admin  ? 0.9 %  sodium chloride infusion  500 mL Intravenous Once Sharyn Creamer, MD      ? ? ?Allergies as of 02/22/2022  ? (No Known Allergies)  ? ? ?Family History  ?Problem Relation Age of Onset  ? Prostate cancer Father   ? Stomach cancer Neg Hx   ? Colon cancer  Neg Hx   ? Rectal cancer Neg Hx   ? Throat cancer Neg Hx   ? ? ?Social History  ? ?Socioeconomic History  ? Marital status: Widowed  ?  Spouse name: Not on file  ? Number of children: Not on file  ? Years of education: Not on file  ? Highest education level: Not on file  ?Occupational History  ? Not on file  ?Tobacco Use  ? Smoking status: Never  ? Smokeless tobacco: Never  ?Vaping Use  ? Vaping Use: Never used  ?Substance and Sexual Activity  ? Alcohol use: Yes  ?  Comment: rarely  ? Drug use: Never  ? Sexual activity: Not Currently  ?Other Topics Concern  ? Not on file  ?Social History Narrative  ? Not on file  ? ?Social Determinants of Health  ? ?Financial Resource Strain: Not on file  ?Food Insecurity: Not on file  ?Transportation Needs: Not on file  ?Physical Activity: Not on file  ?Stress: Not on file  ?Social Connections: Not on file  ?Intimate Partner Violence: Not on file  ? ? ?Physical Exam: ?Vital signs in last 24 hours: ?BP (!) 115/53 (BP Location: Right Arm, Patient Position: Sitting, Cuff Size: Normal)   Pulse (!) 57   Temp (!) 97.5 ?F (36.4 ?C) (Temporal)   Ht '5\' 4"'$  (1.626 m)   Wt 177 lb (80.3 kg)   SpO2 99%   BMI 30.38 kg/m?  ?GEN: NAD ?EYE: Sclerae anicteric ?ENT: MMM ?  CV: Non-tachycardic ?Pulm: No increased WOB ?GI: Soft ?NEURO:  Alert & Oriented ? ? ?Christia Reading, MD ?[facility] ? ? ?02/22/2022 2:04 PM ? ?

## 2022-02-24 ENCOUNTER — Telehealth: Payer: Self-pay | Admitting: *Deleted

## 2022-02-24 NOTE — Telephone Encounter (Signed)
?  Follow up Call- ? ? ?  02/22/2022  ?  1:33 PM  ?Call back number  ?Post procedure Call Back phone  # [phone]  ?Permission to leave phone message Yes  ?  ? ?Patient questions: ? ?Do you have a fever, pain , or abdominal swelling? No. ?Pain Score  0 * ? ?Have you tolerated food without any problems? Yes.   ? ?Have you been able to return to your normal activities? Yes.   ? ?Do you have any questions about your discharge instructions: ?Diet   No. ?Medications  No. ?Follow up visit  No. ? ?Do you have questions or concerns about your Care? No. ? ?Actions: ?* If pain score is 4 or above: ?No action needed, pain <4. ? ? ?

## 2022-02-28 ENCOUNTER — Encounter: Payer: Self-pay | Admitting: Dietician

## 2022-02-28 ENCOUNTER — Encounter: Payer: BC Managed Care – PPO | Attending: Internal Medicine | Admitting: Dietician

## 2022-02-28 DIAGNOSIS — K2100 Gastro-esophageal reflux disease with esophagitis, without bleeding: Secondary | ICD-10-CM | POA: Insufficient documentation

## 2022-02-28 DIAGNOSIS — Z713 Dietary counseling and surveillance: Secondary | ICD-10-CM | POA: Insufficient documentation

## 2022-02-28 NOTE — Patient Instructions (Addendum)
Take the time to write down a list of all the things that you need to do, as well as potential roadblocks, for some of the big tasks you currently have going on. Look at what things require your attention and what you can let go. Take the time to think about solutions for the potential roadblocks you may encounter. ? ?Try this activity when it comes to your house being built, and your daughter's upcoming birthday. ? ?Continue to work on finding motivation to get up and have breakfast each morning. Set an alarm on your phone and put it on a table away from where you have to get out of bed to turn it off. This can help you get to a point where you're used to getting up and moving when you wake up. ? ? ?

## 2022-02-28 NOTE — Progress Notes (Signed)
Medical Nutrition Therapy  ?Appointment Start time:  1100  Appointment End time:  1130 ? ?Primary concerns today: Reflux  ?Referral diagnosis: K21.9 - GERD ?Preferred learning style: No preference indicated ?Learning readiness: Contemplating ? ? ?NUTRITION ASSESSMENT  ? ?Anthropometrics  ?None taken  ? ?Clinical ?Medical Hx: GERD, Low bone density, NEW: Gastric Ulcer, Polyps, Diverticulosis ?Medications: NEW: Pantoprazole ?Labs: TC - 235, LDL - 136 ?Notable Signs/Symptoms: Consistent reflux, cough ? ?Lifestyle & Dietary Hx ?Pt had an endoscopy and colonoscopy done since last visit. Pt reports findings of ulcer in the stomach, polyps in the colon, and diverticulosis. Pt reports that their gastroenterologist took biopsies of their esophageal tissues and ulcer, pt is awaiting results in 1-3 weeks. ?Pt has begun taking pantoprazole BID for the past 5 days. ?Pt reports that their stress level is still very high. ?Pt reports stress due to over-thinking things, not being able to live in the present, and worrying about the unknown. ?Pt reports difficulty getting out of bed to start their day and eat breakfast in the morning. ?Pt reports still lacking the motivation to walk. ? ? ?Estimated daily fluid intake: 76 oz, pt has been drinking alkaline water for ~3 years ?Supplements: Daily Supplement pack (Vitamins, minerals, etc.) ?Sleep: Sleeps well, doesn't get waken up ?Stress / self-care: 8/10, life transitions over the past 3 years. ?Current average weekly physical activity: ADLs, reports being sedentary ? ? ?24-Hr Dietary Recall ?First Meal: Raisin Bran, Coconut milk ?Snack:  ?Second Meal: Spaghetti squash w/red sauce, cheese ?Snack:  ?Third Meal: Grilled chicken breast, large portion of zucchini, onions, peppers, brussells sprouts ?Snack: ?Beverages: water ? ? ?NUTRITION DIAGNOSIS  ?NB-1.1 Food and nutrition-related knowledge deficit As related to GERD.  As evidenced by high stress level, inconsistent meal timing, and late  night snacking on high fat foods. ? ? ?NUTRITION INTERVENTION  ?Nutrition education (E-1) on the following topics:  ?Educated pt on potential food sources (high fat foods,spicy foods, high refined sugar foods, caffeine, etc.) that can trigger reflux and/or other GERD symptoms including chest pain. Educated pt to avoid eating within 2-3 hours of going to bed to minimize reflux. Advised pt to sleep with head elevated to decrease reflux. Educated pt on the potential health complications that can occur related to uncontrolled GERD, including stomach ulcers and esophageal erosion/cancer.  ?Educate pt on factors that can elevate LDL cholesterol, including high dietary intake of saturated fats. Educate pt on identifying sources of saturated fats, and how to make alternative food choices to lower saturated fat intake.  ? ?Handouts Provided Include  ?GERD Nutrition Therapy ?Proteins food list ?Fats food list ? ?Learning Style & Readiness for Change ?Teaching method utilized: Visual & Auditory  ?Demonstrated degree of understanding via: Teach Back  ?Barriers to learning/adherence to lifestyle change: Stress ? ?Goals Established by Pt ?Take the time to write down a list of all the things that you need to do, as well as potential roadblocks, for some of the big tasks you currently have going on. Look at what things require your attention and what you can let go. Take the time to think about solutions for the potential roadblocks you may encounter. ?Try this activity when it comes to your house being built, and your daughter's upcoming birthday. ?Continue to work on finding motivation to get up and have breakfast each morning. Set an alarm on your phone and put it on a table away from where you have to get out of bed to turn it off. This  can help you get to a point where you're used to getting up and moving when you wake up. ? ? ?MONITORING & EVALUATION ?Dietary intake, weekly physical activity, and symptoms of reflux  PRN ? ?Next Steps  ?Patient is to follow up with RD PRN ? ?

## 2022-03-01 ENCOUNTER — Encounter: Payer: Self-pay | Admitting: Internal Medicine

## 2022-05-09 ENCOUNTER — Ambulatory Visit: Payer: BC Managed Care – PPO | Admitting: Skilled Nursing Facility1

## 2022-11-07 ENCOUNTER — Encounter: Payer: Self-pay | Admitting: Nurse Practitioner

## 2022-11-07 ENCOUNTER — Ambulatory Visit (INDEPENDENT_AMBULATORY_CARE_PROVIDER_SITE_OTHER): Payer: Medicare PPO | Admitting: Nurse Practitioner

## 2022-11-07 VITALS — BP 122/74 | HR 68 | Temp 98.5°F | Ht 65.5 in | Wt 172.6 lb

## 2022-11-07 DIAGNOSIS — R2242 Localized swelling, mass and lump, left lower limb: Secondary | ICD-10-CM | POA: Diagnosis not present

## 2022-11-07 DIAGNOSIS — R221 Localized swelling, mass and lump, neck: Secondary | ICD-10-CM

## 2022-11-07 DIAGNOSIS — E7849 Other hyperlipidemia: Secondary | ICD-10-CM | POA: Diagnosis not present

## 2022-11-07 DIAGNOSIS — Z0000 Encounter for general adult medical examination without abnormal findings: Secondary | ICD-10-CM | POA: Diagnosis not present

## 2022-11-07 DIAGNOSIS — M818 Other osteoporosis without current pathological fracture: Secondary | ICD-10-CM | POA: Diagnosis not present

## 2022-11-07 NOTE — Progress Notes (Signed)
Worthy Keeler, DNP, AGNP-c [place], [place] [address] Main Office [phone]  BP 122/74   Pulse 68   Temp 98.5 F (36.9 C)   Ht 5' 5.5" (1.664 m)   Wt 172 lb 9.6 oz (78.3 kg)   BMI 28.29 kg/m    Subjective:    Patient ID: Tina Harris, female    DOB: [birth year], 66 y.o.   MRN: [id number]  HPI: Tina Harris is a 66 y.o. female presenting on 11/07/2022 for comprehensive medical examination.   Current medical concerns include: Was having a pain in her side when she was on calcium, so she stopped this and the pain went away.She has been taking [place] sea moss for about three weeks to help with the calcium. (Purple sea moss)  She reports regular vision exams q1-5y: Yes  She reports regular dental exams q 16m  Yes  The patient eats a regular, healthy diet. She endorses exercise and/or activity of:  regular/routine  She denies concerns with STI today, testing was not ordered  A comprehensive review of systems was negative.  Most Recent Depression Screen:     11/07/2022   11:28 AM 01/18/2022    2:26 PM 11/03/2021    6:47 PM  Depression screen PHQ 2/9  Decreased Interest 0 0 0  Down, Depressed, Hopeless 0 0 0  PHQ - 2 Score 0 0 0   Most Recent Anxiety Screen:      No data to display         Most Recent Fall Screen:    11/07/2022   11:27 AM 01/18/2022    2:26 PM 11/03/2021    6:47 PM  FHavanain the past year? 0 0 0  Number falls in past yr: 0  0  Injury with Fall? 0  0  Risk for fall due to : No Fall Risks  No Fall Risks  Follow up Falls evaluation completed  Falls evaluation completed    Past medical history, surgical history, medications, allergies, family history and social history reviewed with patient today and changes made to appropriate areas of the chart.  Past Medical History:  Past Medical History:  Diagnosis Date   GERD (gastroesophageal reflux disease)    History of colon polyps     Medications:  No current outpatient medications on file prior to visit.   No current facility-administered medications on file prior to visit.   Surgical History:  Past Surgical History:  Procedure Laterality Date   COLONOSCOPY     2014-2015   UPPER GASTROINTESTINAL ENDOSCOPY     Allergies:  No Known Allergies Family History:  Family History  Problem Relation Age of Onset   Prostate cancer Father    Stomach cancer Neg Hx    Colon cancer Neg Hx    Rectal cancer Neg Hx    Throat cancer Neg Hx        Objective:    BP 122/74   Pulse 68   Temp 98.5 F (36.9 C)   Ht 5' 5.5" (1.664 m)   Wt 172 lb 9.6 oz (78.3 kg)   BMI 28.29 kg/m   Wt Readings from Last 3 Encounters:  11/07/22 172 lb 9.6 oz (78.3 kg)  02/22/22 177 lb (80.3 kg)  01/13/22 177 lb 12.8 oz (80.6 kg)    Physical Exam Vitals and nursing note reviewed.  Constitutional:      General: She is not in acute distress.  Appearance: Normal appearance.  HENT:     Head: Normocephalic and atraumatic.     Right Ear: Hearing, tympanic membrane, ear canal and external ear normal.     Left Ear: Hearing, tympanic membrane, ear canal and external ear normal.     Nose: Nose normal.     Right Sinus: No maxillary sinus tenderness or frontal sinus tenderness.     Left Sinus: No maxillary sinus tenderness or frontal sinus tenderness.     Mouth/Throat:     Lips: Pink.     Mouth: Mucous membranes are moist.     Pharynx: Oropharynx is clear.  Eyes:     General: Lids are normal. Vision grossly intact.     Extraocular Movements: Extraocular movements intact.     Conjunctiva/sclera: Conjunctivae normal.     Pupils: Pupils are equal, round, and reactive to light.     Funduscopic exam:    Right eye: Red reflex present.        Left eye: Red reflex present.    Visual Fields: Right eye visual fields normal and left eye visual fields normal.  Neck:     Thyroid: Thyroid mass present. No thyromegaly.     Vascular: No carotid  bruit.   Cardiovascular:     Rate and Rhythm: Normal rate and regular rhythm.     Chest Wall: PMI is not displaced.     Pulses: Normal pulses.          Dorsalis pedis pulses are 2+ on the right side and 2+ on the left side.       Posterior tibial pulses are 2+ on the right side and 2+ on the left side.     Heart sounds: Normal heart sounds. No murmur heard. Pulmonary:     Effort: Pulmonary effort is normal. No respiratory distress.     Breath sounds: Normal breath sounds.  Abdominal:     General: Abdomen is flat. Bowel sounds are normal. There is no distension.     Palpations: Abdomen is soft. There is no hepatomegaly, splenomegaly or mass.     Tenderness: There is no abdominal tenderness. There is no right CVA tenderness, left CVA tenderness, guarding or rebound.  Musculoskeletal:        General: Normal range of motion.     Cervical back: Full passive range of motion without pain, normal range of motion and neck supple. No tenderness.     Right lower leg: No edema.     Left lower leg: No edema.  Feet:     Left foot:     Toenail Condition: Left toenails are normal.  Lymphadenopathy:     Cervical: No cervical adenopathy.     Upper Body:     Right upper body: No supraclavicular adenopathy.     Left upper body: No supraclavicular adenopathy.  Skin:    General: Skin is warm and dry.     Capillary Refill: Capillary refill takes less than 2 seconds.     Nails: There is no clubbing.  Neurological:     General: No focal deficit present.     Mental Status: She is alert and oriented to person, place, and time.     GCS: GCS eye subscore is 4. GCS verbal subscore is 5. GCS motor subscore is 6.     Sensory: Sensation is intact.     Motor: Motor function is intact.     Coordination: Coordination is intact.     Gait: Gait is intact.  Deep Tendon Reflexes: Reflexes are normal and symmetric.  Psychiatric:        Attention and Perception: Attention normal.        Mood and Affect: Mood  normal.        Speech: Speech normal.        Behavior: Behavior normal. Behavior is cooperative.        Thought Content: Thought content normal.        Cognition and Memory: Cognition and memory normal.        Judgment: Judgment normal.     Results for orders placed or performed in visit on 11/07/22  CBC with Differential/Platelet  Result Value Ref Range   WBC 3.0 (L) 3.4 - 10.8 x10E3/uL   RBC 4.80 3.77 - 5.28 x10E6/uL   Hemoglobin 13.5 11.1 - 15.9 g/dL   Hematocrit 40.2 34.0 - 46.6 %   MCV 84 79 - 97 fL   MCH 28.1 26.6 - 33.0 pg   MCHC 33.6 31.5 - 35.7 g/dL   RDW 13.1 11.7 - 15.4 %   Platelets 322 150 - 450 x10E3/uL   Neutrophils 42 Not Estab. %   Lymphs 47 Not Estab. %   Monocytes 9 Not Estab. %   Eos 2 Not Estab. %   Basos 0 Not Estab. %   Neutrophils Absolute 1.2 (L) 1.4 - 7.0 x10E3/uL   Lymphocytes Absolute 1.4 0.7 - 3.1 x10E3/uL   Monocytes Absolute 0.3 0.1 - 0.9 x10E3/uL   EOS (ABSOLUTE) 0.1 0.0 - 0.4 x10E3/uL   Basophils Absolute 0.0 0.0 - 0.2 x10E3/uL   Immature Granulocytes 0 Not Estab. %   Immature Grans (Abs) 0.0 0.0 - 0.1 x10E3/uL  Comprehensive metabolic panel  Result Value Ref Range   Glucose 93 70 - 99 mg/dL   BUN 10 8 - 27 mg/dL   Creatinine, Ser 0.86 0.57 - 1.00 mg/dL   eGFR 75 >59 mL/min/1.73   BUN/Creatinine Ratio 12 12 - 28   Sodium 143 134 - 144 mmol/L   Potassium 4.5 3.5 - 5.2 mmol/L   Chloride 105 96 - 106 mmol/L   CO2 23 20 - 29 mmol/L   Calcium 9.7 8.7 - 10.3 mg/dL   Total Protein 7.4 6.0 - 8.5 g/dL   Albumin 4.9 3.9 - 4.9 g/dL   Globulin, Total 2.5 1.5 - 4.5 g/dL   Albumin/Globulin Ratio 2.0 1.2 - 2.2   Bilirubin Total 0.3 0.0 - 1.2 mg/dL   Alkaline Phosphatase 89 44 - 121 IU/L   AST 23 0 - 40 IU/L   ALT 12 0 - 32 IU/L  Hemoglobin A1c  Result Value Ref Range   Hgb A1c MFr Bld 5.5 4.8 - 5.6 %   Est. average glucose Bld gHb Est-mCnc 111 mg/dL  Lipid panel  Result Value Ref Range   Cholesterol, Total 230 (H) 100 - 199 mg/dL    Triglycerides 65 0 - 149 mg/dL   HDL 92 >39 mg/dL   VLDL Cholesterol Cal 11 5 - 40 mg/dL   LDL Chol Calc (NIH) 127 (H) 0 - 99 mg/dL   Chol/HDL Ratio 2.5 0.0 - 4.4 ratio  VITAMIN D 25 Hydroxy (Vit-D Deficiency, Fractures)  Result Value Ref Range   Vit D, 25-Hydroxy 73.3 30.0 - 100.0 ng/mL  TSH  Result Value Ref Range   TSH 1.800 0.450 - 4.500 uIU/mL    IMMUNIZATIONS:   FLu vaccine declined Prevnar 13: Prevnar 13 N/A for this patient Pneumovax 23: Pneumovax declined, patient will complete  at a later date Vac Shingrix: Shingrix declined, patient will complete at a later date HPV: HPV N/A for this patient Tetanus: Tetanus completed in the last 10 years  HEALTH MAINTENANCE: Pap Smear HM Status: is due and to be scheduled by patient for later completion Mammogram HM Status: is up to date Colon Cancer Screening HM Status: is up to date Bone Density HM Status: is up to date STI Testing HM Status: is not applicable for this patient  Eye Exam HM Status: is up to date Urine Micro HM Status: is not applicable for this patient  Spirometry HM Status: is not applicable for this patient      Assessment & Plan:   Problem List Items Addressed This Visit     Encounter for annual physical exam - Primary   Relevant Orders   CBC with Differential/Platelet (Completed)   Comprehensive metabolic panel (Completed)   Hemoglobin A1c (Completed)   Lipid panel (Completed)   VITAMIN D 25 Hydroxy (Vit-D Deficiency, Fractures) (Completed)   Localized swelling, mass and lump, neck    Swelling noted at the area of the thyroid at the central anterior neck. Will send order for ultrasound for monitoring. Will make changes to the plan of care as necessary based on results.       Relevant Orders   US THYROID   TSH (Completed)   Nodule of skin of left lower leg    Appearance similar to dermatofibroma. Will send referral for dermatology for further evaluation. If unable to get in with dermatology in a  reasonable amount of time we can biopsy in the area in the office.       Relevant Orders   Ambulatory referral to Dermatology   Other Visit Diagnoses     Health care maintenance       Relevant Orders   CBC with Differential/Platelet (Completed)   Comprehensive metabolic panel (Completed)   Hemoglobin A1c (Completed)   Lipid panel (Completed)   VITAMIN D 25 Hydroxy (Vit-D Deficiency, Fractures) (Completed)          Follow up plan: Return in about 1 year (around 11/08/2023) for CPE.  NEXT PREVENTATIVE PHYSICAL DUE IN 1 YEAR.  PATIENT COUNSELING PROVIDED FOR ALL ADULT PATIENTS:  Consume a well balanced diet low in saturated fats, cholesterol, and moderation in carbohydrates.   This can be as simple as monitoring portion sizes and cutting back on sugary beverages such as soda and juice to start with.    Daily water consumption of at least 64 ounces.  Physical activity at least 180 minutes per week, if just starting out.   This can be as simple as taking the stairs instead of the elevator and walking 2-3 laps around the office  purposefully every day.   STD protection, partner selection, and regular testing if high risk.  Limited consumption of alcoholic beverages if alcohol is consumed.  For women, I recommend no more than 7 alcoholic beverages per week, spread out throughout the week.  Avoid "binge" drinking or consuming large quantities of alcohol in one setting.   Please let me know if you feel you may need help with reduction or quitting alcohol consumption.   Avoidance of nicotine, if used.  Please let me know if you feel you may need help with reduction or quitting nicotine use.   Daily mental health attention.  This can be in the form of 5 minute daily meditation, prayer, journaling, yoga, reflection, etc.   Purposeful attention to your emotions  and mental state can significantly improve your overall wellbeing and Health.  Please know that I am here to help you  with all of your health care goals and am happy to work with you to find a solution that works best for you.  The greatest advice I have received with any changes in life are to take it one step at a time, that even means if all you can focus on is the next 60 seconds, then do that and celebrate your victories.  With any changes in life, you will have set backs, and that is OK. The important thing to remember is, if you have a set back, it is not a failure, it is an opportunity to try again!  Health Maintenance Recommendations Screening Testing Mammogram Every 1 -2 years based on history and risk factors Starting at age 47 Pap Smear Ages 21-39 every 3 years Ages 67-65 every 5 years with HPV testing More frequent testing may be required based on results and history Colon Cancer Screening Every 1-10 years based on test performed, risk factors, and history Starting at age 23 Bone Density Screening Every 2-10 years based on history Starting at age 15 for women Recommendations for men differ based on medication usage, history, and risk factors AAA Screening One time ultrasound Men 72-63 years old who have every smoked Lung Cancer Screening Low Dose Lung CT every 12 months Age 51-80 years with a 30 pack-year smoking history who still smoke or who have quit within the last 15 years  Screening Labs Routine  Labs: Complete Blood Count (CBC), Complete Metabolic Panel (CMP), Cholesterol (Lipid Panel) Every 6-12 months based on history and medications May be recommended more frequently based on current conditions or previous results Hemoglobin A1c Lab Every 3-12 months based on history and previous results Starting at age 67 or earlier with diagnosis of diabetes, high cholesterol, BMI >26, and/or risk factors Frequent monitoring for patients with diabetes to ensure blood sugar control Thyroid Panel (TSH w/ T3 & T4) Every 6 months based on history, symptoms, and risk factors May be repeated  more often if on medication HIV One time testing for all patients 32 and older May be repeated more frequently for patients with increased risk factors or exposure Hepatitis C One time testing for all patients 29 and older May be repeated more frequently for patients with increased risk factors or exposure Gonorrhea, Chlamydia Every 12 months for all sexually active persons 13-24 years Additional monitoring may be recommended for those who are considered high risk or who have symptoms PSA Men 47-51 years old with risk factors Additional screening may be recommended from age 23-69 based on risk factors, symptoms, and history  Vaccine Recommendations Tetanus Booster All adults every 10 years Flu Vaccine All patients 6 months and older every year COVID Vaccine All patients 12 years and older Initial dosing with booster May recommend additional booster based on age and health history HPV Vaccine 2 doses all patients age 91-26 Dosing may be considered for patients over 26 Shingles Vaccine (Shingrix) 2 doses all adults 51 years and older Pneumonia (Pneumovax 23) All adults 46 years and older May recommend earlier dosing based on health history Pneumonia (Prevnar 35) All adults 32 years and older Dosed 1 year after Pneumovax 23  Additional Screening, Testing, and Vaccinations may be recommended on an individualized basis based on family history, health history, risk factors, and/or exposure.

## 2022-11-07 NOTE — Patient Instructions (Addendum)
It was so good to see you today!!  I do recommend the shingles vaccine. You can have this done at any pharmacy. I recommend that you wait to have this done on a day that you don't have anything else to do the next day because it can make you feel a little bad.   Keep taking the sea moss! That is fascinating that you make your own gel.   I will let you know what your labs show and if we need to make any changes I will let you know.   I have ordered an ultrasound of your thyroid and neck to check on the mass that is present. They will call you to schedule.     I would use voltaren gel and heat to the area of your back where the pain is. There are exercises below that will help strengthen the area.   Scapular Winging Rehab Ask your health care provider which exercises are safe for you. Do exercises exactly as told by your health care provider and adjust them as directed. It is normal to feel mild stretching, pulling, tightness, or discomfort as you do these exercises. Stop right away if you feel sudden pain or your pain gets worse. Do not begin these exercises until told by your health care provider. Strengthening exercises These exercises build strength and endurance in your shoulder. Endurance is the ability to use your muscles for a long time, even after they get tired. Scapular depression and retraction After you have practiced this exercise, try doing it without the arm support. Then, try doing it while standing instead of sitting. Sit on a stable chair. Support your arms in front of you with pillows, armrests, or a tabletop. Keep your elbows near the sides of your body. Gently move your shoulder blades down (scapular depression) and back toward your spine (retraction). Relax the muscles on the tops of your shoulders and in the back of your neck. Hold for ____10______ seconds. Slowly release the tension, and relax your muscles completely before you repeat the exercise. Repeat ____2______  times. Complete this exercise _____2_____ times a day. Scapular protraction, standing  Stand so you are facing a wall, about one arm-length away from the wall. Place your hands on the wall and straighten your elbows. Move your shoulder blades down, toward the middle of your spine. Keep your shoulder blades down and move them forward, toward the wall (protraction). You should feel your shoulder blades sliding forward, around your rib cage. If you are not sure that you are doing this exercise correctly, ask your health care provider for more instructions. Hold for ____10______ seconds. Slowly return to the starting position. Let your muscles relax completely before you repeat this exercise. Repeat _____2_____ times. Complete this exercise _____2_____ times a day. Scapular protraction, supine  Lie on your back on a firm surface (supine position). Hold a ____2 lb (can of food)_____ weight in your left / right hand. Raise your left / right arm straight into the air so your hand is directly above your shoulder joint. Push the weight into the air so your shoulder blade lifts off the surface that you are lying on. The scapula will push forward (protraction). Do not move your head, neck, or back. Hold for _____10_____ seconds. Slowly return to the starting position. Let your muscles relax completely before you repeat this exercise. Repeat ____2______ times. Complete this exercise ____2______ times a day. Scapular protraction, quadruped  Get on your hands and knees (quadruped position).  Your hands should be directly below your shoulder blades (scapulae). Straighten your arms until your elbows are locked. Round your back as much as you can. Think about lifting your rib cage up into your shoulder blades. The scapula will push downward or forward (protraction). Keep your neck muscles relaxed. Hold for ____10______ seconds. Slowly return to the starting position. Let your muscles relax completely before  you repeat this exercise. Repeat ___2_______ times. Complete this exercise ____2______ times a day. Scapular depression  Sit in a stable chair that has armrests. Sit upright, with your feet flat on the floor. Put your hands on the armrests with your elbows bent and your fingers pointing forward. Your hands should be about even with the sides of your body. Push down on the armrests to lift yourself off the chair. Straighten your elbows and lift yourself up as much as you comfortably can. Move your shoulder blades down and back (scapular depression). Do not let your shoulders move up toward your ears. Keep your feet on the ground. As you get stronger, your feet should support less of your body weight as you do this exercise. Hold for ____10______ seconds. Slowly lower yourself back into the chair. Repeat ____2______ times. Complete this exercise _____2_____ times a day. Shoulder extension, prone  Lie on your abdomen on a firm surface (prone position) so your left / right arm hangs over the edge. Hold a _____can of food_____ weight in your left / right hand so your palm faces in toward your body. Your arm should be straight. Squeeze your shoulder blade down toward the middle of your back. Slowly raise your arm behind you and toward the ceiling, up to the height of the surface that you are lying on (extension). Keep your arm straight. Hold for ____10______ seconds. Slowly return to the starting position and relax your muscles. Repeat _____2_____ times. Complete this exercise _____2_____ times a day. Horizontal abduction, prone  Lie on your abdomen on a firm surface (prone position) so your left / right arm hangs over the edge. Hold a __________ weight in your hand so your palm faces toward your feet. Your arm should be straight. Squeeze your shoulder blade down toward the middle of your back. Raise your left/right arm up to the height of the surface that you are lying on. Keep your arm  straight and point your thumb forward. At the top of the movement, your palm should face the floor. Hold for __________ seconds. Slowly return to the starting position and relax your muscles. Repeat __________ times. Complete this exercise __________ times a day. Scapular retraction  Sit in a stable chair without armrests, or stand. Secure an exercise band to a stable object in front of you so it is at shoulder height. Hold one end of the exercise band in each hand. Your palms should face down. Straighten your elbows and lift your arms up to shoulder height. Step back, away from the secured end of the exercise band, until the band stretches. Squeeze your shoulder blades together (scapular retraction) and move your elbows back behind you. Do not shrug your shoulders while you do this. Your elbows should stay at about chest or shoulder height. Keep your upper arms lifted, away from your sides. Hold for __________ seconds. Slowly return to the starting position. Repeat __________ times. Complete this exercise __________ times a day. Shoulder extension  Sit in a stable chair without armrests, or stand. Secure an exercise band to a stable object in front of you  so it is at shoulder height. Hold one end of the exercise band in each hand. Your palms should face each other. Straighten your elbows and lift your hands up to shoulder height. Step back, away from the secured end of the exercise band, until the band stretches. Squeeze your shoulder blades together and pull your hands down to the sides of your thighs (extension). Stop when your hands are straight down by your sides. Do not let your hands go behind your body. Hold for __________ seconds. Slowly return to the starting position. Repeat __________ times. Complete this exercise __________ times a day. Scapular retraction and external rotation  Sit in a stable chair without armrests, or stand. Secure an exercise band to a stable object  in front of you so it is at shoulder height. Hold one end of the exercise band in each hand. Your palms should face down. Straighten your elbows and lift your hands up to shoulder height. Step back, away from the secured end of the exercise band, until the band stretches. Bend your elbows and raise your hands up to the height of your head. This is external rotation. Your palms should face out, in front of you, at the top of the movement. Squeeze your shoulder blades together during this movement. This is scapular retraction. Hold for __________ seconds. Slowly straighten your arms to return to the starting position. Repeat __________ times. Complete this exercise __________ times a day. This information is not intended to replace advice given to you by your health care provider. Make sure you discuss any questions you have with your health care provider. Document Revised: 03/07/2022 Document Reviewed: 07/20/2021 Elsevier Patient Education  [place]. Scapular Winging  Scapular winging is a condition that causes part of the shoulder blade (scapula) to stick out from the back and look similar to a wing. This causes pain and weakness in the shoulder and upper arm. Scapular winging happens when the muscle groups that hold the scapula in place against the back (scapular stabilizers) stop functioning normally. Scapular winging most commonly affects one shoulder blade, but it can affect both shoulder blades. This condition usually goes away with treatment. What are the causes? This condition may be caused by nerve damage, weakness (fatigue), or loss of motor control in the scapular stabilizers, which can result from: A sudden (acute) injury. Injury from overuse. Infection. A reaction to a medicine. Exposure to poisons (toxins). Surgery on the neck or chest. What increases the risk? This condition is more likely to develop in athletes, especially athletes who play sports that involve a lot  of arm and shoulder movement. What are the signs or symptoms? The main symptoms of this condition are aching or burning shoulder pain and a shoulder that has an abnormal shape (deformity). The scapula may wing out from the back all the time, or only with upper arm and shoulder movement. The scapula may wing out near the shoulder or near the spine. Other signs and symptoms may include: Pain that gets worse when raising your arms above your head. Pain that moves to the arm or neck. Decreased range of motion. Weakness. How is this diagnosed? This condition is diagnosed based on: Your symptoms. Your medical history, including your history of recent injuries. A physical exam. Tests, such as: Electromyogram (EMG). This is an electrical nerve study that is used to find out which nerves and muscles are affected. X-rays to check for a broken (fractured) bone. A CT scan. An MRI. How is  this treated? Treatment for this condition may include: Wearing a brace around your chest to hold your scapula against your back. Avoiding all activities that cause pain. Having physical therapy. Taking medicines to help relieve pain. Having surgery. This may be needed if your condition has not improved in 6-12 months. After surgery, you will need to wear a brace and start physical therapy exercises. Follow these instructions at home: If you have a brace: Wear it as told by your health care provider. Remove it only as told by your health care provider. Loosen the brace if it is too tight around your chest, or if your arm tingles, becomes numb, or turns cold and blue. Keep the brace clean and dry. Check the skin around the brace every day. Tell your health care provider about any concerns. Managing pain, stiffness, and swelling     If directed, put ice on the painful area. If you have a removable brace, remove it as told by your health care provider. Put ice in a plastic bag. Place a towel between your skin  and the bag. Leave the ice on for 20 minutes, 2-3 times a day. If directed, apply heat to the affected area before you exercise. Use the heat source that your health care provider recommends, such as a moist heat pack or a heating pad. Place a towel between your skin and the heat source. Leave the heat on for 20-30 minutes. Remove the heat if your skin turns bright red. This is especially important if you are unable to feel pain, heat, or cold. You may have a greater risk of getting burned. Bathing Ask your health care provider if you may remove the brace before bathing or showering. If the brace is not waterproof: Do not let it get wet. Cover it with a watertight covering when you take a bath or shower. Activity Ask your health care provider when it is safe for you to drive. Return to your normal activities as told by your health care provider. Ask your health care provider what activities are safe for you. Do exercises as told by your health care provider. General instructions Take over-the-counter and prescription medicines only as told by your health care provider. Do not use any products that contain nicotine or tobacco, such as cigarettes, e-cigarettes, and chewing tobacco. These can delay bone healing. If you need help quitting, ask your health care provider. Keep all follow-up visits as told by your health care provider. This is important. Contact a health care provider if: You have pain that gets worse or does not get better with medicine. Your symptoms get worse or do not go away after 6 months of treatment. Summary Scapular winging is a condition that causes part of the shoulder blade to stick out from the back and look similar to a wing. This causes pain and weakness in the shoulder and upper arm. It can be caused by acute injury, infection, surgery, overuse, or exposure to a medicine, or poison. It is treated with rest, medications, a brace, and surgery if needed. This  information is not intended to replace advice given to you by your health care provider. Make sure you discuss any questions you have with your health care provider. Document Revised: 03/07/2022 Document Reviewed: 07/20/2021 Elsevier Patient Education  [place].

## 2022-11-08 LAB — COMPREHENSIVE METABOLIC PANEL
ALT: 12 IU/L (ref 0–32)
AST: 23 IU/L (ref 0–40)
Albumin/Globulin Ratio: 2 (ref 1.2–2.2)
Albumin: 4.9 g/dL (ref 3.9–4.9)
Alkaline Phosphatase: 89 IU/L (ref 44–121)
BUN/Creatinine Ratio: 12 (ref 12–28)
BUN: 10 mg/dL (ref 8–27)
Bilirubin Total: 0.3 mg/dL (ref 0.0–1.2)
CO2: 23 mmol/L (ref 20–29)
Calcium: 9.7 mg/dL (ref 8.7–10.3)
Chloride: 105 mmol/L (ref 96–106)
Creatinine, Ser: 0.86 mg/dL (ref 0.57–1.00)
Globulin, Total: 2.5 g/dL (ref 1.5–4.5)
Glucose: 93 mg/dL (ref 70–99)
Potassium: 4.5 mmol/L (ref 3.5–5.2)
Sodium: 143 mmol/L (ref 134–144)
Total Protein: 7.4 g/dL (ref 6.0–8.5)
eGFR: 75 mL/min/{1.73_m2} (ref >59)

## 2022-11-08 LAB — CBC WITH DIFFERENTIAL/PLATELET
Basophils Absolute: 0 10*3/uL (ref 0.0–0.2)
Basos: 0 %
EOS (ABSOLUTE): 0.1 10*3/uL (ref 0.0–0.4)
Eos: 2 %
Hematocrit: 40.2 % (ref 34.0–46.6)
Hemoglobin: 13.5 g/dL (ref 11.1–15.9)
Immature Grans (Abs): 0 10*3/uL (ref 0.0–0.1)
Immature Granulocytes: 0 %
Lymphocytes Absolute: 1.4 10*3/uL (ref 0.7–3.1)
Lymphs: 47 %
MCH: 28.1 pg (ref 26.6–33.0)
MCHC: 33.6 g/dL (ref 31.5–35.7)
MCV: 84 fL (ref 79–97)
Monocytes Absolute: 0.3 10*3/uL (ref 0.1–0.9)
Monocytes: 9 %
Neutrophils Absolute: 1.2 10*3/uL — ABNORMAL LOW (ref 1.4–7.0)
Neutrophils: 42 %
Platelets: 322 10*3/uL (ref 150–450)
RBC: 4.8 x10E6/uL (ref 3.77–5.28)
RDW: 13.1 % (ref 11.7–15.4)
WBC: 3 10*3/uL — ABNORMAL LOW (ref 3.4–10.8)

## 2022-11-08 LAB — HEMOGLOBIN A1C
Est. average glucose Bld gHb Est-mCnc: 111 mg/dL
Hgb A1c MFr Bld: 5.5 % (ref 4.8–5.6)

## 2022-11-08 LAB — LIPID PANEL
Chol/HDL Ratio: 2.5 ratio (ref 0.0–4.4)
Cholesterol, Total: 230 mg/dL — ABNORMAL HIGH (ref 100–199)
HDL: 92 mg/dL (ref >39)
LDL Chol Calc (NIH): 127 mg/dL — ABNORMAL HIGH (ref 0–99)
Triglycerides: 65 mg/dL (ref 0–149)
VLDL Cholesterol Cal: 11 mg/dL (ref 5–40)

## 2022-11-08 LAB — TSH: TSH: 1.8 u[IU]/mL (ref 0.450–4.500)

## 2022-11-08 LAB — VITAMIN D 25 HYDROXY (VIT D DEFICIENCY, FRACTURES): Vit D, 25-Hydroxy: 73.3 ng/mL (ref 30.0–100.0)

## 2022-11-13 DIAGNOSIS — Z0000 Encounter for general adult medical examination without abnormal findings: Secondary | ICD-10-CM | POA: Insufficient documentation

## 2022-11-13 DIAGNOSIS — R221 Localized swelling, mass and lump, neck: Secondary | ICD-10-CM | POA: Insufficient documentation

## 2022-11-13 DIAGNOSIS — R2242 Localized swelling, mass and lump, left lower limb: Secondary | ICD-10-CM | POA: Insufficient documentation

## 2022-11-13 NOTE — Assessment & Plan Note (Signed)
CPE today with abnormalities noted on exam and documented Labs pending. Will make changes as necessary based on results.  Review of HM activities and recommendations discussed and provided on AVS Anticipatory guidance, diet, and exercise recommendations provided.  Medications, allergies, and hx reviewed and updated as necessary.  Plan to f/u with CPE in 1 year or sooner for acute/chronic health needs as directed.

## 2022-11-13 NOTE — Assessment & Plan Note (Signed)
Swelling noted at the area of the thyroid at the central anterior neck. Will send order for ultrasound for monitoring. Will make changes to the plan of care as necessary based on results.

## 2022-11-13 NOTE — Assessment & Plan Note (Signed)
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 Will send referral for dermatology for further evaluation. If unable to get in with dermatology in a reasonable amount of time we can biopsy in the area in the office.

## 2022-11-14 ENCOUNTER — Ambulatory Visit (HOSPITAL_COMMUNITY)
Admission: RE | Admit: 2022-11-14 | Discharge: 2022-11-14 | Disposition: A | Discharge status: Home / Self Care | Payer: Medicare PPO | Source: Ambulatory Visit | Attending: Nurse Practitioner | Admitting: Nurse Practitioner

## 2022-11-14 DIAGNOSIS — E041 Nontoxic single thyroid nodule: Secondary | ICD-10-CM | POA: Diagnosis not present

## 2022-11-14 DIAGNOSIS — R221 Localized swelling, mass and lump, neck: Secondary | ICD-10-CM | POA: Diagnosis not present

## 2023-01-17 DIAGNOSIS — D239 Other benign neoplasm of skin, unspecified: Secondary | ICD-10-CM | POA: Diagnosis not present

## 2023-02-14 ENCOUNTER — Telehealth: Payer: Self-pay | Admitting: Nurse Practitioner

## 2023-02-14 NOTE — Telephone Encounter (Signed)
Contacted Genevie Ann to schedule their annual wellness visit. Welcome to Medicare visit Due by 03/14/23.  Shirlean Mylar [phone]

## 2023-04-24 ENCOUNTER — Ambulatory Visit (INDEPENDENT_AMBULATORY_CARE_PROVIDER_SITE_OTHER): Payer: Medicare PPO

## 2023-04-24 VITALS — Ht 65.0 in | Wt 170.0 lb

## 2023-04-24 DIAGNOSIS — Z0000 Encounter for general adult medical examination without abnormal findings: Secondary | ICD-10-CM

## 2023-04-24 NOTE — Patient Instructions (Signed)
Tina Harris , Thank you for taking time to come for your Medicare Wellness Visit. I appreciate your ongoing commitment to your health goals. Please review the following plan we discussed and let me know if I can assist you in the future.   These are the goals we discussed:  Goals      Patient Stated     04/24/2023 wants to lose 20 pounds        This is a list of the screening recommended for you and due dates:  Health Maintenance  Topic Date Due   COVID-19 Vaccine (1) Never done   Zoster (Shingles) Vaccine (1 of 2) Never done   Pneumonia Vaccine (1 of 1 - PCV) 11/14/2023*   Flu Shot  06/14/2023   Mammogram  12/17/2023   DEXA scan (bone density measurement)  01/11/2024   Medicare Annual Wellness Visit  04/23/2024   DTaP/Tdap/Td vaccine (2 - Tdap) 11/04/2031   Colon Cancer Screening  02/23/2032   HPV Vaccine  Aged Out   Hepatitis C Screening  Discontinued  *Topic was postponed. The date shown is not the original due date.    Advanced directives: Advance directive discussed with you today.   Conditions/risks identified: none  Next appointment: Follow up in one year for your annual wellness visit    Preventive Care 65 Years and Older, Female Preventive care refers to lifestyle choices and visits with your health care provider that can promote health and wellness. What does preventive care include? A yearly physical exam. This is also called an annual well check. Dental exams once or twice a year. Routine eye exams. Ask your health care provider how often you should have your eyes checked. Personal lifestyle choices, including: Daily care of your teeth and gums. Regular physical activity. Eating a healthy diet. Avoiding tobacco and drug use. Limiting alcohol use. Practicing safe sex. Taking low-dose aspirin every day. Taking vitamin and mineral supplements as recommended by your health care provider. What happens during an annual well check? The services and screenings  done by your health care provider during your annual well check will depend on your age, overall health, lifestyle risk factors, and family history of disease. Counseling  Your health care provider may ask you questions about your: Alcohol use. Tobacco use. Drug use. Emotional well-being. Home and relationship well-being. Sexual activity. Eating habits. History of falls. Memory and ability to understand (cognition). Work and work Astronomer. Reproductive health. Screening  You may have the following tests or measurements: Height, weight, and BMI. Blood pressure. Lipid and cholesterol levels. These may be checked every 5 years, or more frequently if you are over 32 years old. Skin check. Lung cancer screening. You may have this screening every year starting at age 31 if you have a 30-pack-year history of smoking and currently smoke or have quit within the past 15 years. Fecal occult blood test (FOBT) of the stool. You may have this test every year starting at age 75. Flexible sigmoidoscopy or colonoscopy. You may have a sigmoidoscopy every 5 years or a colonoscopy every 10 years starting at age 45. Hepatitis C blood test. Hepatitis B blood test. Sexually transmitted disease (STD) testing. Diabetes screening. This is done by checking your blood sugar (glucose) after you have not eaten for a while (fasting). You may have this done every 1-3 years. Bone density scan. This is done to screen for osteoporosis. You may have this done starting at age 33. Mammogram. This may be done every 1-2 years.  Talk to your health care provider about how often you should have regular mammograms. Talk with your health care provider about your test results, treatment options, and if necessary, the need for more tests. Vaccines  Your health care provider may recommend certain vaccines, such as: Influenza vaccine. This is recommended every year. Tetanus, diphtheria, and acellular pertussis (Tdap, Td)  vaccine. You may need a Td booster every 10 years. Zoster vaccine. You may need this after age 19. Pneumococcal 13-valent conjugate (PCV13) vaccine. One dose is recommended after age 48. Pneumococcal polysaccharide (PPSV23) vaccine. One dose is recommended after age 13. Talk to your health care provider about which screenings and vaccines you need and how often you need them. This information is not intended to replace advice given to you by your health care provider. Make sure you discuss any questions you have with your health care provider. Document Released: 11/26/2015 Document Revised: 07/19/2016 Document Reviewed: 08/31/2015 Elsevier Interactive Patient Education  2017 [place] Prevention in the Home Falls can cause injuries. They can happen to people of all ages. There are many things you can do to make your home safe and to help prevent falls. What can I do on the outside of my home? Regularly fix the edges of walkways and driveways and fix any cracks. Remove anything that might make you trip as you walk through a door, such as a raised step or threshold. Trim any bushes or trees on the path to your home. Use bright outdoor lighting. Clear any walking paths of anything that might make someone trip, such as rocks or tools. Regularly check to see if handrails are loose or broken. Make sure that both sides of any steps have handrails. Any raised decks and porches should have guardrails on the edges. Have any leaves, snow, or ice cleared regularly. Use sand or salt on walking paths during winter. Clean up any spills in your garage right away. This includes oil or grease spills. What can I do in the bathroom? Use night lights. Install grab bars by the toilet and in the tub and shower. Do not use towel bars as grab bars. Use non-skid mats or decals in the tub or shower. If you need to sit down in the shower, use a plastic, non-slip stool. Keep the floor dry. Clean up any  water that spills on the floor as soon as it happens. Remove soap buildup in the tub or shower regularly. Attach bath mats securely with double-sided non-slip rug tape. Do not have throw rugs and other things on the floor that can make you trip. What can I do in the bedroom? Use night lights. Make sure that you have a light by your bed that is easy to reach. Do not use any sheets or blankets that are too big for your bed. They should not hang down onto the floor. Have a firm chair that has side arms. You can use this for support while you get dressed. Do not have throw rugs and other things on the floor that can make you trip. What can I do in the kitchen? Clean up any spills right away. Avoid walking on wet floors. Keep items that you use a lot in easy-to-reach places. If you need to reach something above you, use a strong step stool that has a grab bar. Keep electrical cords out of the way. Do not use floor polish or wax that makes floors slippery. If you must use wax, use non-skid floor wax.  Do not have throw rugs and other things on the floor that can make you trip. What can I do with my stairs? Do not leave any items on the stairs. Make sure that there are handrails on both sides of the stairs and use them. Fix handrails that are broken or loose. Make sure that handrails are as long as the stairways. Check any carpeting to make sure that it is firmly attached to the stairs. Fix any carpet that is loose or worn. Avoid having throw rugs at the top or bottom of the stairs. If you do have throw rugs, attach them to the floor with carpet tape. Make sure that you have a light switch at the top of the stairs and the bottom of the stairs. If you do not have them, ask someone to add them for you. What else can I do to help prevent falls? Wear shoes that: Do not have high heels. Have rubber bottoms. Are comfortable and fit you well. Are closed at the toe. Do not wear sandals. If you use a  stepladder: Make sure that it is fully opened. Do not climb a closed stepladder. Make sure that both sides of the stepladder are locked into place. Ask someone to hold it for you, if possible. Clearly mark and make sure that you can see: Any grab bars or handrails. First and last steps. Where the edge of each step is. Use tools that help you move around (mobility aids) if they are needed. These include: Canes. Walkers. Scooters. Crutches. Turn on the lights when you go into a dark area. Replace any light bulbs as soon as they burn out. Set up your furniture so you have a clear path. Avoid moving your furniture around. If any of your floors are uneven, fix them. If there are any pets around you, be aware of where they are. Review your medicines with your doctor. Some medicines can make you feel dizzy. This can increase your chance of falling. Ask your doctor what other things that you can do to help prevent falls. This information is not intended to replace advice given to you by your health care provider. Make sure you discuss any questions you have with your health care provider. Document Released: 08/26/2009 Document Revised: 04/06/2016 Document Reviewed: 12/04/2014 Elsevier Interactive Patient Education  2017 [employer].

## 2023-04-24 NOTE — Progress Notes (Signed)
I connected with  Tina Harris on 04/24/23 by a audio enabled telemedicine application and verified that I am speaking with the correct person using two identifiers.  Patient Location: Home  Provider Location: Office/Clinic  I discussed the limitations of evaluation and management by telemedicine. The patient expressed understanding and agreed to proceed. Subjective:   Tina Harris is a 67 y.o. female who presents for an Initial Medicare Annual Wellness Visit.  Patient Medicare AWV questionnaire was completed by the patient on 04/20/2023; I have confirmed that all information answered by patient is correct and no changes since this date.    Review of Systems     Cardiac Risk Factors include: advanced age (>49men, >59 women)     Objective:    Today's Vitals   04/24/23 1428 04/24/23 1429  Weight: 170 lb (77.1 kg)   Height: 5\' 5"  (1.651 m)   PainSc:  7    Body mass index is 28.29 kg/m.     04/24/2023    2:37 PM 01/18/2022    2:26 PM  Advanced Directives  Does Patient Have a Medical Advance Directive? No No  Would patient like information on creating a medical advance directive?  Yes (MAU/Ambulatory/Procedural Areas - Information given)    Current Medications (verified) No outpatient encounter medications on file as of 04/24/2023.   No facility-administered encounter medications on file as of 04/24/2023.    Allergies (verified) Patient has no known allergies.   History: Past Medical History:  Diagnosis Date   GERD (gastroesophageal reflux disease)    History of colon polyps    Past Surgical History:  Procedure Laterality Date   COLONOSCOPY     2014-2015   UPPER GASTROINTESTINAL ENDOSCOPY     Family History  Problem Relation Age of Onset   Prostate cancer Father    Stomach cancer Neg Hx    Colon cancer Neg Hx    Rectal cancer Neg Hx    Throat cancer Neg Hx    Social History   Socioeconomic History   Marital status: Widowed    Spouse name: Not on  file   Number of children: Not on file   Years of education: Not on file   Highest education level: Not on file  Occupational History   Not on file  Tobacco Use   Smoking status: Never   Smokeless tobacco: Never  Vaping Use   Vaping Use: Never used  Substance and Sexual Activity   Alcohol use: Yes    Comment: rarely   Drug use: Never   Sexual activity: Not Currently  Other Topics Concern   Not on file  Social History Narrative   Not on file   Social Determinants of Health   Financial Resource Strain: Low Risk  (04/24/2023)   Overall Financial Resource Strain (CARDIA)    Difficulty of Paying Living Expenses: Not hard at all  Food Insecurity: No Food Insecurity (04/24/2023)   Hunger Vital Sign    Worried About Running Out of Food in the Last Year: Never true    Ran Out of Food in the Last Year: Never true  Transportation Needs: No Transportation Needs (04/24/2023)   PRAPARE - Administrator, Civil Service (Medical): No    Lack of Transportation (Non-Medical): No  Physical Activity: Insufficiently Active (04/24/2023)   Exercise Vital Sign    Days of Exercise per Week: 4 days    Minutes of Exercise per Session: 20 min  Stress: No Stress Concern Present (04/24/2023)  [employer] of Occupational Health - Occupational Stress Questionnaire    Feeling of Stress : Not at all  Social Connections: Unknown (04/20/2023)   Social Connection and Isolation Panel [NHANES]    Frequency of Communication with Friends and Family: More than three times a week    Frequency of Social Gatherings with Friends and Family: Three times a week    Attends Religious Services: Not on file    Active Member of Clubs or Organizations: No    Attends Banker Meetings: Never    Marital Status: Widowed    Tobacco Counseling Counseling given: Not Answered   Clinical Intake:  Pre-visit preparation completed: Yes  Pain : 0-10 Pain Score: 7  Pain Type: Chronic pain Pain  Location: Back Pain Orientation: Right, Upper Pain Descriptors / Indicators: Burning Pain Onset: More than a month ago Pain Frequency: Intermittent Pain Relieving Factors: heating pad helps  Pain Relieving Factors: heating pad helps  Nutritional Status: BMI 25 -29 Overweight Nutritional Risks: None Diabetes: No  How often do you need to have someone help you when you read instructions, pamphlets, or other written materials from your doctor or pharmacy?: 1 - Never  Diabetic? no  Interpreter Needed?: No  Information entered by :: NAllen LPN   Activities of Daily Living    04/20/2023    1:40 PM  In your present state of health, do you have any difficulty performing the following activities:  Hearing? 0  Vision? 0  Difficulty concentrating or making decisions? 0  Walking or climbing stairs? 0  Dressing or bathing? 0  Doing errands, shopping? 0  Preparing Food and eating ? N  Using the Toilet? N  In the past six months, have you accidently leaked urine? N  Do you have problems with loss of bowel control? N  Managing your Medications? N  Managing your Finances? N  Housekeeping or managing your Housekeeping? N    Patient Care Team: Early, Sung Amabile, NP as PCP - General (Nurse Practitioner)  Indicate any recent Medical Services you may have received from other than [facility] providers in the past year (date may be approximate).     Assessment:   This is a routine wellness examination for Tina Harris.  Hearing/Vision screen Vision Screening - Comments:: Regular eye exams, [facility]  Dietary issues and exercise activities discussed: Current Exercise Habits: Home exercise routine, Type of exercise: walking, Time (Minutes): 20, Frequency (Times/Week): 4, Weekly Exercise (Minutes/Week): 80   Goals Addressed             This Visit's Progress    Patient Stated       04/24/2023 wants to lose 20 pounds       Depression Screen    04/24/2023    2:39 PM 11/07/2022   11:28 AM  01/18/2022    2:26 PM 11/03/2021    6:47 PM  PHQ 2/9 Scores  PHQ - 2 Score 0 0 0 0  PHQ- 9 Score 1       Fall Risk    04/20/2023    1:40 PM 11/07/2022   11:27 AM 01/18/2022    2:26 PM 11/03/2021    6:47 PM  Fall Risk   Falls in the past year? 0 0 0 0  Number falls in past yr: 0 0  0  Injury with Fall? 0 0  0  Risk for fall due to : No Fall Risks No Fall Risks  No Fall Risks  Follow up Falls prevention discussed;Education provided;Falls  evaluation completed Falls evaluation completed  Falls evaluation completed    FALL RISK PREVENTION PERTAINING TO THE HOME:  Any stairs in or around the home? No  If so, are there any without handrails? N/a Home free of loose throw rugs in walkways, pet beds, electrical cords, etc? Yes  Adequate lighting in your home to reduce risk of falls? Yes   ASSISTIVE DEVICES UTILIZED TO PREVENT FALLS:  Life alert? No  Use of a cane, walker or w/c? No  Grab bars in the bathroom? No  Shower chair or bench in shower? No  Elevated toilet seat or a handicapped toilet? Yes   TIMED UP AND GO:  Was the test performed? No .      Cognitive Function:        04/24/2023    2:42 PM  6CIT Screen  What Year? 0 points  What month? 0 points  What time? 0 points  Count back from 20 0 points  Months in reverse 0 points  Repeat phrase 0 points  Total Score 0 points    Immunizations Immunization History  Administered Date(s) Administered   Td 11/03/2021    TDAP status: Up to date  Flu Vaccine status: Declined, Education has been provided regarding the importance of this vaccine but patient still declined. Advised may receive this vaccine at local pharmacy or Health Dept. Aware to provide a copy of the vaccination record if obtained from local pharmacy or Health Dept. Verbalized acceptance and understanding.  Pneumococcal vaccine status: Declined,  Education has been provided regarding the importance of this vaccine but patient still declined. Advised may  receive this vaccine at local pharmacy or Health Dept. Aware to provide a copy of the vaccination record if obtained from local pharmacy or Health Dept. Verbalized acceptance and understanding.   Covid-19 vaccine status: Completed vaccines  Qualifies for Shingles Vaccine? Yes   Zostavax completed No   Shingrix Completed?: No.    Education has been provided regarding the importance of this vaccine. Patient has been advised to call insurance company to determine out of pocket expense if they have not yet received this vaccine. Advised may also receive vaccine at local pharmacy or Health Dept. Verbalized acceptance and understanding.  Screening Tests Health Maintenance  Topic Date Due   Medicare Annual Wellness (AWV)  Never done   COVID-19 Vaccine (1) Never done   Zoster Vaccines- Shingrix (1 of 2) Never done   Pneumonia Vaccine 47+ Years old (1 of 1 - PCV) 11/14/2023 (Originally 04/05/2022)   INFLUENZA VACCINE  06/14/2023   MAMMOGRAM  12/17/2023   DEXA SCAN  01/11/2024   DTaP/Tdap/Td (2 - Tdap) 11/04/2031   Colonoscopy  02/23/2032   HPV VACCINES  Aged Out   Hepatitis C Screening  Discontinued    Health Maintenance  Health Maintenance Due  Topic Date Due   Medicare Annual Wellness (AWV)  Never done   COVID-19 Vaccine (1) Never done   Zoster Vaccines- Shingrix (1 of 2) Never done    Colorectal cancer screening: Type of screening: Colonoscopy. Completed 02/22/2022. Repeat every 10 years  Mammogram status: Completed 12/16/2021. Repeat every year  Bone Density status: Completed 01/10/2022.   Lung Cancer Screening: (Low Dose CT Chest recommended if Age 31-80 years, 30 pack-year currently smoking OR have quit w/in 15years.) does not qualify.   Lung Cancer Screening Referral: no  Additional Screening:  Hepatitis C Screening: does not qualify;   Vision Screening: Recommended annual ophthalmology exams for early detection of glaucoma and  other disorders of the eye. Is the patient up to  date with their annual eye exam?  Yes  Who is the provider or what is the name of the office in which the patient attends annual eye exams? [facility] If pt is not established with a provider, would they like to be referred to a provider to establish care? No .   Dental Screening: Recommended annual dental exams for proper oral hygiene  Community Resource Referral / Chronic Care Management: CRR required this visit?  No   CCM required this visit?  No      Plan:     I have personally reviewed and noted the following in the patient's chart:   Medical and social history Use of alcohol, tobacco or illicit drugs  Current medications and supplements including opioid prescriptions. Patient is not currently taking opioid prescriptions. Functional ability and status Nutritional status Physical activity Advanced directives List of other physicians Hospitalizations, surgeries, and ER visits in previous 12 months Vitals Screenings to include cognitive, depression, and falls Referrals and appointments  In addition, I have reviewed and discussed with patient certain preventive protocols, quality metrics, and best practice recommendations. A written personalized care plan for preventive services as well as general preventive health recommendations were provided to patient.     Barb Merino, LPN   [id number]/[id number]   Nurse Notes: complaints of right upper back pain. Appointment made with Sarabeth for 04/30/2023.  Due to this being a virtual visit, the after visit summary with patients personalized plan was offered to patient via mail or my-chart. Patient would like to access on my-chart

## 2023-04-26 IMAGING — MG MM DIGITAL SCREENING BILAT W/ TOMO AND CAD
8 series · 8 of 24 positions shown · non-contrast
Comparison: None.

CLINICAL DATA: Screening.

EXAM:
DIGITAL SCREENING BILATERAL MAMMOGRAM WITH TOMOSYNTHESIS AND CAD
TECHNIQUE: Bilateral screening digital craniocaudal and mediolateral oblique
mammograms were obtained. Bilateral screening digital breast
tomosynthesis was performed. The images were evaluated with
computer-aided detection.

[R CC synth-2D]
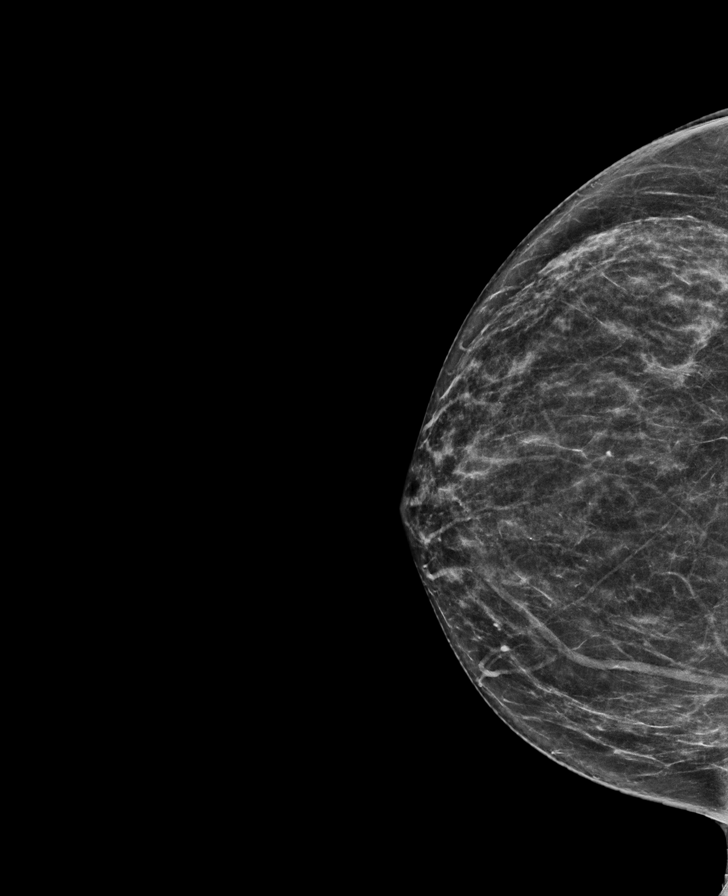

[R MLO synth-2D]
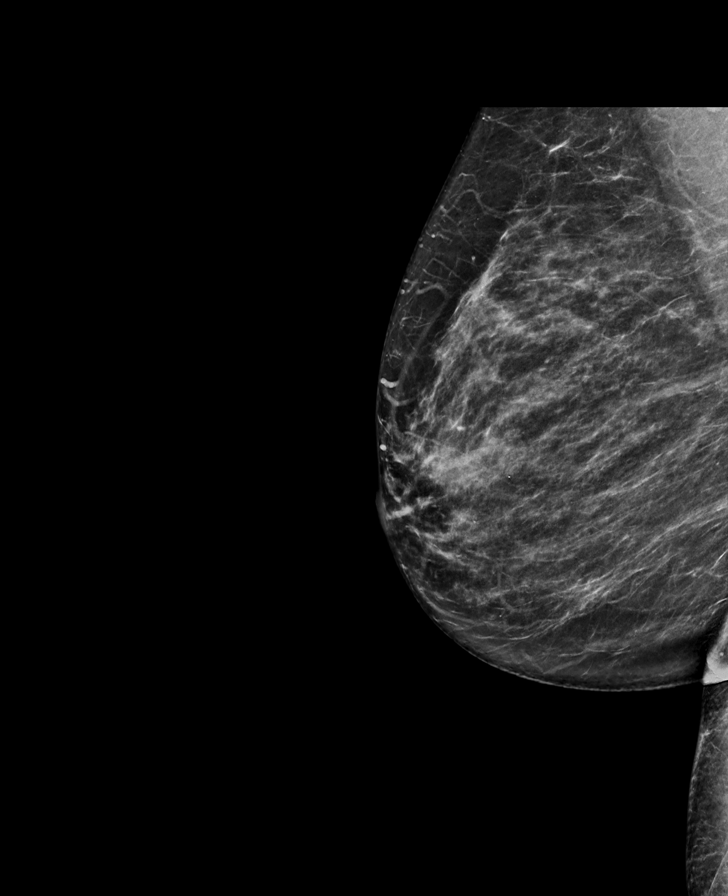

[L MLO synth-2D]
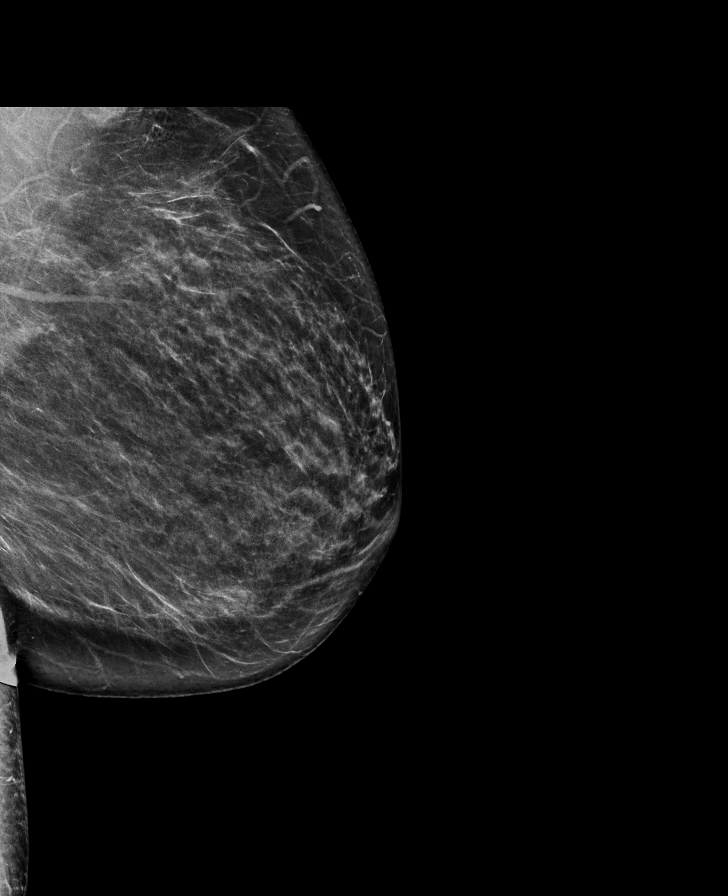

[L CC synth-2D]
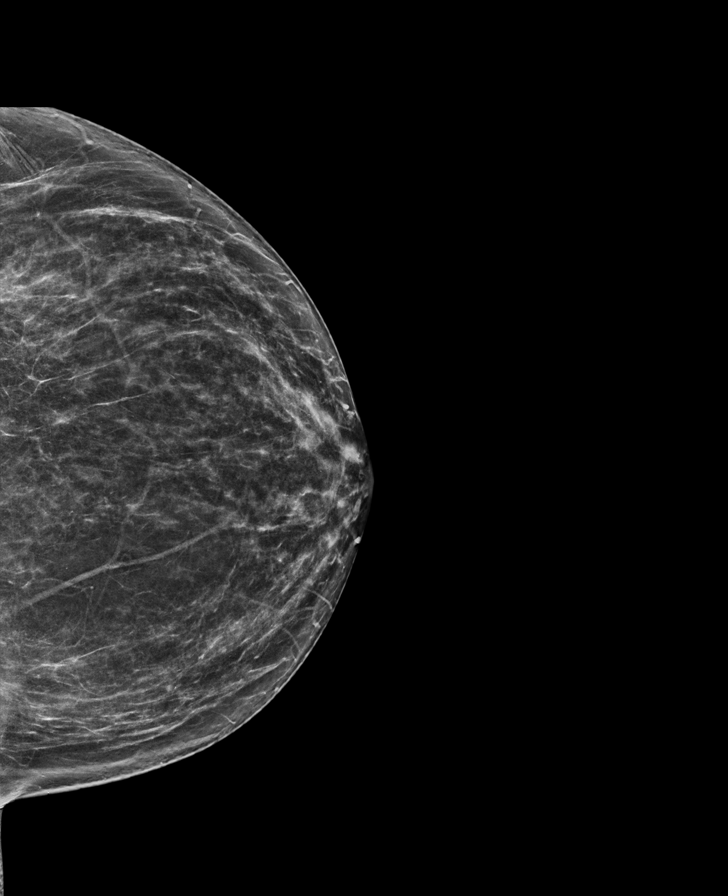

[L CC tomo · tomo slice 34/67.0]
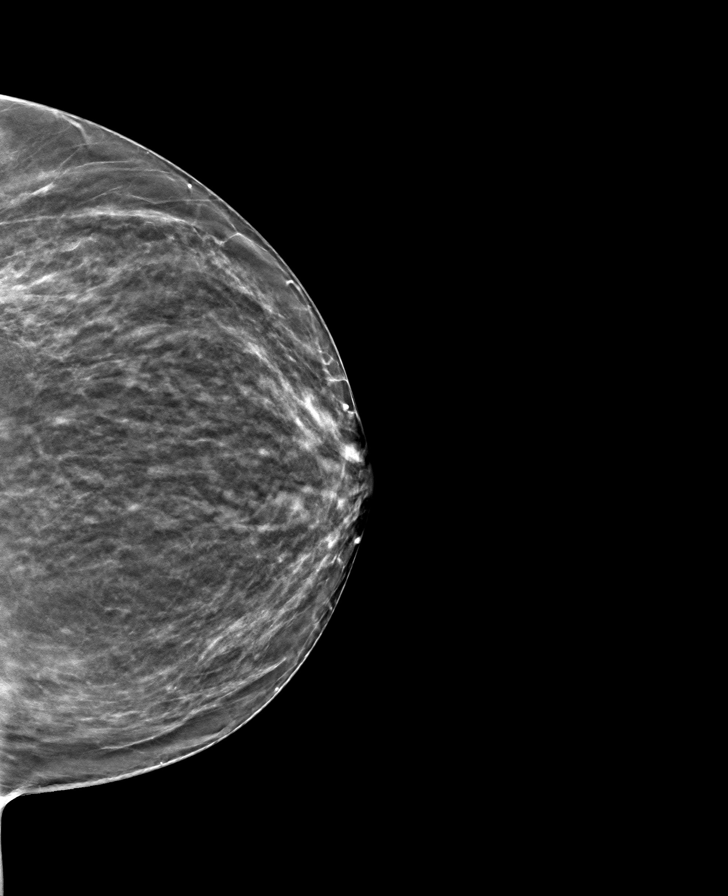

[R MLO tomo · tomo slice 38/75.0]
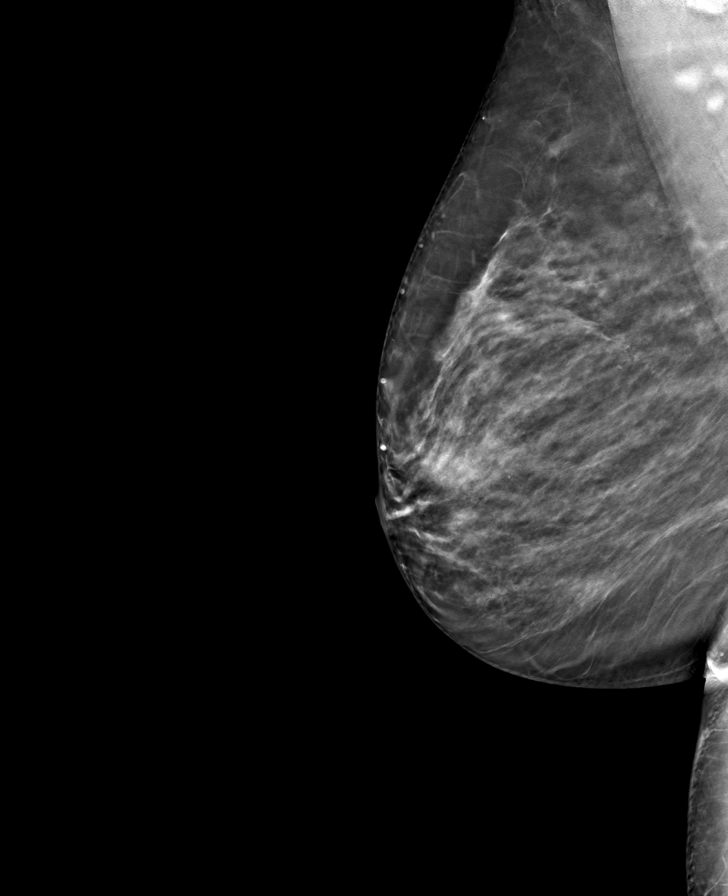

[R CC tomo · tomo slice 33/65.0]
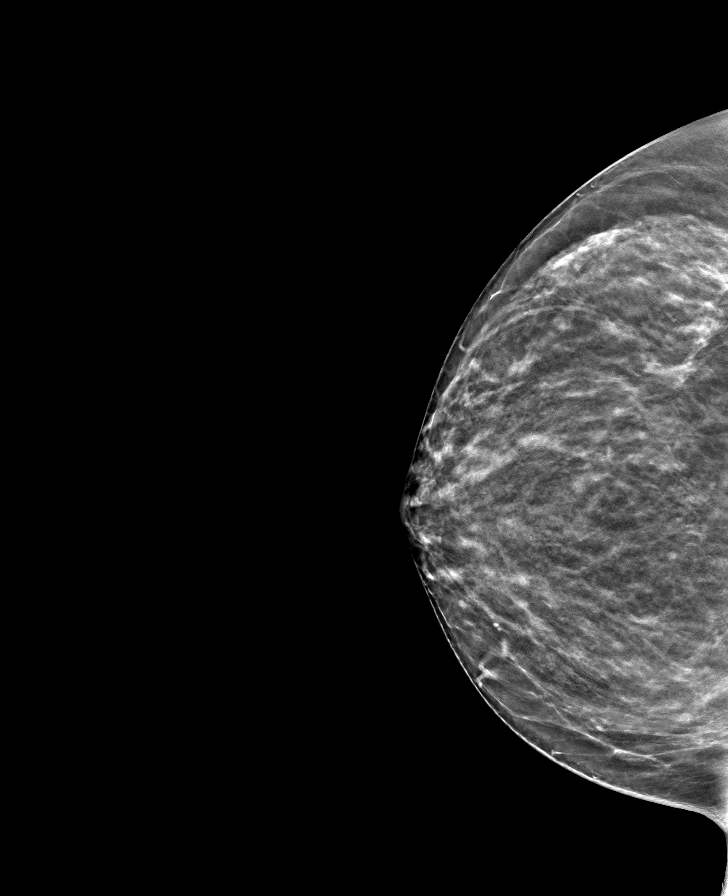

[L MLO tomo · tomo slice 39/76.0]
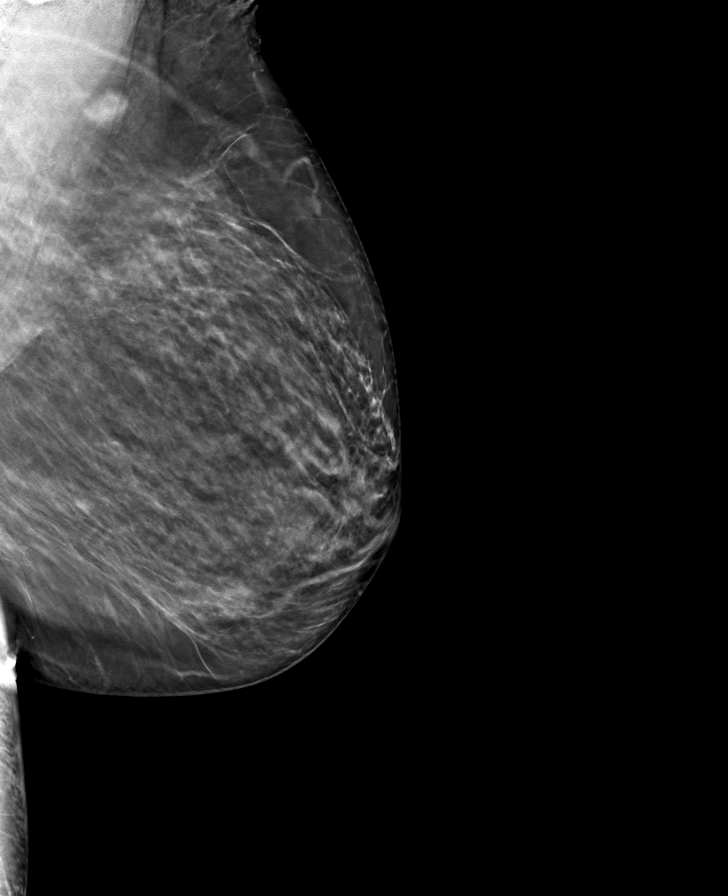

[8 of 24 positions shown; findings below may reference images not displayed]

ACR Breast Density Category b: There are scattered areas of
fibroglandular density.
FINDINGS: There are no findings suspicious for malignancy.
IMPRESSION: No mammographic evidence of malignancy. A result letter of this
screening mammogram will be mailed directly to the patient.

RECOMMENDATION:
Screening mammogram in one year. (Code:XG-X-X7B)

BI-RADS CATEGORY  1: Negative.

## 2023-04-30 ENCOUNTER — Ambulatory Visit: Payer: Medicare PPO | Admitting: Nurse Practitioner

## 2023-11-08 ENCOUNTER — Ambulatory Visit (INDEPENDENT_AMBULATORY_CARE_PROVIDER_SITE_OTHER): Payer: Medicare PPO | Admitting: Nurse Practitioner

## 2023-11-08 ENCOUNTER — Encounter: Payer: Self-pay | Admitting: Nurse Practitioner

## 2023-11-08 VITALS — BP 108/68 | HR 86 | Ht 65.0 in | Wt 154.0 lb

## 2023-11-08 DIAGNOSIS — Z136 Encounter for screening for cardiovascular disorders: Secondary | ICD-10-CM | POA: Diagnosis not present

## 2023-11-08 DIAGNOSIS — M810 Age-related osteoporosis without current pathological fracture: Secondary | ICD-10-CM

## 2023-11-08 DIAGNOSIS — R634 Abnormal weight loss: Secondary | ICD-10-CM | POA: Insufficient documentation

## 2023-11-08 DIAGNOSIS — Z130 Encounter for screening for diseases of the blood and blood-forming organs and certain disorders involving the immune mechanism: Secondary | ICD-10-CM

## 2023-11-08 DIAGNOSIS — Z13228 Encounter for screening for other metabolic disorders: Secondary | ICD-10-CM

## 2023-11-08 DIAGNOSIS — Z0000 Encounter for general adult medical examination without abnormal findings: Secondary | ICD-10-CM

## 2023-11-08 DIAGNOSIS — H539 Unspecified visual disturbance: Secondary | ICD-10-CM | POA: Diagnosis not present

## 2023-11-08 DIAGNOSIS — M542 Cervicalgia: Secondary | ICD-10-CM | POA: Diagnosis not present

## 2023-11-08 DIAGNOSIS — Z13220 Encounter for screening for lipoid disorders: Secondary | ICD-10-CM

## 2023-11-08 DIAGNOSIS — Z1321 Encounter for screening for nutritional disorder: Secondary | ICD-10-CM | POA: Diagnosis not present

## 2023-11-08 DIAGNOSIS — K2100 Gastro-esophageal reflux disease with esophagitis, without bleeding: Secondary | ICD-10-CM | POA: Diagnosis not present

## 2023-11-08 DIAGNOSIS — G8929 Other chronic pain: Secondary | ICD-10-CM

## 2023-11-08 DIAGNOSIS — Z1329 Encounter for screening for other suspected endocrine disorder: Secondary | ICD-10-CM

## 2023-11-08 NOTE — Progress Notes (Signed)
Shawna Clamp, DNP, AGNP-c Tina Harris [id number] [place], [place] [address] Main Office [phone]  BP 108/68   Pulse 86   Ht 5\' 5"  (1.651 m)   Wt 154 lb (69.9 kg)   SpO2 96%   BMI 25.63 kg/m    Subjective:    Patient ID: Tina Harris, female    DOB: [birth year], 67 y.o.   MRN: [id number]  HPI: Tina Harris is a 67 y.o. female presenting on 11/08/2023 for comprehensive medical examination.   History of Present Illness Tina Harris presents for an annual physical exam.    She mentions a significant weight loss of over thirty pounds and about thirty-two inches due to a metabolic reset program she started in July. The program involves a diet change to organic, natural foods, and a mindset change around food. She also reports a significant improvement in her overall health and a decrease in joint pain since starting the program.  Despite the weight loss, Tina Harris continues to experience symptoms of GERD, which she attributes to late-night eating habits.   She has noticed a change in her vision, describing it as cloudy and experiencing floating objects. She had her eyes checked about half a year ago.    She also reports a burning sensation and pain in her right arm and neck, especially when standing and cooking. The pain, initially localized to the right quarter panel of her back, has now moved up to her neck. She has been seeing a chiropractor intermittently for this issue.  Tina Harris also mentions a change in her bowel habits due to the diet change but does not express any concerns about it. She reports frequent urination, which she attributes to her high water intake. She does not take any medications and reports a positive change in her attitude, despite past experiences with depression.  Pertinent items are noted in HPI.  IMMUNIZATIONS:   Flu Vaccine: Flu vaccine declined, patient will complete later Prevnar 13: Prevnar 13 declined, patient will complete at  a later date Prevnar 20: Prevnar 20 declined, patient will complete at a later date Pneumovax 23: Pneumovax declined, patient will complete at a later date Vac Shingrix: Shingrix declined, patient will complete at a later date HPV: N/A or Aged Out Tetanus: Tetanus completed in the last 10 years COVID: Declined today. Information on where to obtain the vaccine was provided.  RSV: No  HEALTH MAINTENANCE: Pap Smear HM Status: N/A Mammogram HM Status: is up to date Colon Cancer Screening HM Status: is up to date Bone Density HM Status: is up to date STI Testing HM Status: N/A Lung CT HM Status: N/A  Concerns with vision, hearing, or dentition: Yes: see HPI  Most Recent Depression Screen:     04/24/2023    2:39 PM 11/07/2022   11:28 AM 01/18/2022    2:26 PM 11/03/2021    6:47 PM  Depression screen PHQ 2/9  Decreased Interest 0 0 0 0  Down, Depressed, Hopeless 0 0 0 0  PHQ - 2 Score 0 0 0 0  Altered sleeping 1     Tired, decreased energy 0     Change in appetite 0     Feeling bad or failure about yourself  0     Trouble concentrating 0     Moving slowly or fidgety/restless 0     Suicidal thoughts 0     PHQ-9 Score 1     Difficult doing work/chores Not difficult at all  Most Recent Anxiety Screen:      No data to display         Most Recent Fall Screen:    11/08/2023   11:04 AM 04/20/2023    1:40 PM 11/07/2022   11:27 AM 01/18/2022    2:26 PM 11/03/2021    6:47 PM  Fall Risk   Falls in the past year?  0 0 0 0  Number falls in past yr: 0 0 0  0  Injury with Fall? 0 0 0  0  Risk for fall due to : No Fall Risks No Fall Risks No Fall Risks  No Fall Risks  Follow up  Falls prevention discussed;Education provided;Falls evaluation completed Falls evaluation completed  Falls evaluation completed    Past medical history, surgical history, medications, allergies, family history and social history reviewed with patient today and changes made to appropriate areas of the  chart.  Past Medical History:  Past Medical History:  Diagnosis Date   GERD (gastroesophageal reflux disease)    History of colon polyps    Medications:  No current outpatient medications on file prior to visit.   No current facility-administered medications on file prior to visit.   Surgical History:  Past Surgical History:  Procedure Laterality Date   COLONOSCOPY     2014-2015   UPPER GASTROINTESTINAL ENDOSCOPY     Allergies:  No Known Allergies Family History:  Family History  Problem Relation Age of Onset   Prostate cancer Father    Stomach cancer Neg Hx    Colon cancer Neg Hx    Rectal cancer Neg Hx    Throat cancer Neg Hx        Objective:    BP 108/68   Pulse 86   Ht 5\' 5"  (1.651 m)   Wt 154 lb (69.9 kg)   SpO2 96%   BMI 25.63 kg/m   Wt Readings from Last 3 Encounters:  11/08/23 154 lb (69.9 kg)  04/24/23 170 lb (77.1 kg)  11/07/22 172 lb 9.6 oz (78.3 kg)    Physical Exam Vitals and nursing note reviewed.  Constitutional:      General: She is not in acute distress.    Appearance: Normal appearance.  HENT:     Head: Normocephalic and atraumatic.     Right Ear: Hearing, tympanic membrane, ear canal and external ear normal.     Left Ear: Hearing, tympanic membrane, ear canal and external ear normal.     Nose: Nose normal.     Right Sinus: No maxillary sinus tenderness or frontal sinus tenderness.     Left Sinus: No maxillary sinus tenderness or frontal sinus tenderness.     Mouth/Throat:     Lips: Pink.     Mouth: Mucous membranes are moist.     Pharynx: Oropharynx is clear.  Eyes:     General: Lids are normal. Vision grossly intact.     Extraocular Movements: Extraocular movements intact.     Conjunctiva/sclera: Conjunctivae normal.     Pupils: Pupils are equal, round, and reactive to light.     Funduscopic exam:    Right eye: Red reflex present.        Left eye: Red reflex present.    Visual Fields: Right eye visual fields normal and left  eye visual fields normal.  Neck:     Thyroid: No thyromegaly.     Vascular: No carotid bruit.  Cardiovascular:     Rate and Rhythm: Normal rate and regular  rhythm.     Chest Wall: PMI is not displaced.     Pulses: Normal pulses.          Dorsalis pedis pulses are 2+ on the right side and 2+ on the left side.       Posterior tibial pulses are 2+ on the right side and 2+ on the left side.     Heart sounds: Normal heart sounds. No murmur heard. Pulmonary:     Effort: Pulmonary effort is normal. No respiratory distress.     Breath sounds: Normal breath sounds.  Abdominal:     General: Abdomen is flat. Bowel sounds are normal. There is no distension.     Palpations: Abdomen is soft. There is no hepatomegaly, splenomegaly or mass.     Tenderness: There is no abdominal tenderness. There is no right CVA tenderness, left CVA tenderness, guarding or rebound.  Musculoskeletal:        General: Normal range of motion.     Cervical back: Full passive range of motion without pain, normal range of motion and neck supple. No tenderness.     Right lower leg: No edema.     Left lower leg: No edema.  Feet:     Left foot:     Toenail Condition: Left toenails are normal.  Lymphadenopathy:     Cervical: No cervical adenopathy.     Upper Body:     Right upper body: No supraclavicular adenopathy.     Left upper body: No supraclavicular adenopathy.  Skin:    General: Skin is warm and dry.     Capillary Refill: Capillary refill takes less than 2 seconds.     Nails: There is no clubbing.  Neurological:     General: No focal deficit present.     Mental Status: She is alert and oriented to person, place, and time.     GCS: GCS eye subscore is 4. GCS verbal subscore is 5. GCS motor subscore is 6.     Sensory: Sensation is intact.     Motor: Motor function is intact.     Coordination: Coordination is intact.     Gait: Gait is intact.     Deep Tendon Reflexes: Reflexes are normal and symmetric.   Psychiatric:        Attention and Perception: Attention normal.        Mood and Affect: Mood normal.        Speech: Speech normal.        Behavior: Behavior normal. Behavior is cooperative.        Thought Content: Thought content normal.        Cognition and Memory: Cognition and memory normal.        Judgment: Judgment normal.      Results for orders placed or performed in visit on 11/07/22  CBC with Differential/Platelet   Collection Time: 11/07/22 12:50 PM  Result Value Ref Range   WBC 3.0 (L) 3.4 - 10.8 x10E3/uL   RBC 4.80 3.77 - 5.28 x10E6/uL   Hemoglobin 13.5 11.1 - 15.9 g/dL   Hematocrit 16.1 09.6 - 46.6 %   MCV 84 79 - 97 fL   MCH 28.1 26.6 - 33.0 pg   MCHC 33.6 31.5 - 35.7 g/dL   RDW 04.5 40.9 - 81.1 %   Platelets 322 150 - 450 x10E3/uL   Neutrophils 42 Not Estab. %   Lymphs 47 Not Estab. %   Monocytes 9 Not Estab. %   Eos 2  Not Estab. %   Basos 0 Not Estab. %   Neutrophils Absolute 1.2 (L) 1.4 - 7.0 x10E3/uL   Lymphocytes Absolute 1.4 0.7 - 3.1 x10E3/uL   Monocytes Absolute 0.3 0.1 - 0.9 x10E3/uL   EOS (ABSOLUTE) 0.1 0.0 - 0.4 x10E3/uL   Basophils Absolute 0.0 0.0 - 0.2 x10E3/uL   Immature Granulocytes 0 Not Estab. %   Immature Grans (Abs) 0.0 0.0 - 0.1 x10E3/uL  Comprehensive metabolic panel   Collection Time: 11/07/22 12:50 PM  Result Value Ref Range   Glucose 93 70 - 99 mg/dL   BUN 10 8 - 27 mg/dL   Creatinine, Ser 1.61 0.57 - 1.00 mg/dL   eGFR 75 >09 UE/AVW/0.98   BUN/Creatinine Ratio 12 12 - 28   Sodium 143 134 - 144 mmol/L   Potassium 4.5 3.5 - 5.2 mmol/L   Chloride 105 96 - 106 mmol/L   CO2 23 20 - 29 mmol/L   Calcium 9.7 8.7 - 10.3 mg/dL   Total Protein 7.4 6.0 - 8.5 g/dL   Albumin 4.9 3.9 - 4.9 g/dL   Globulin, Total 2.5 1.5 - 4.5 g/dL   Albumin/Globulin Ratio 2.0 1.2 - 2.2   Bilirubin Total 0.3 0.0 - 1.2 mg/dL   Alkaline Phosphatase 89 44 - 121 IU/L   AST 23 0 - 40 IU/L   ALT 12 0 - 32 IU/L  Hemoglobin A1c   Collection Time: 11/07/22  12:50 PM  Result Value Ref Range   Hgb A1c MFr Bld 5.5 4.8 - 5.6 %   Est. average glucose Bld gHb Est-mCnc 111 mg/dL  Lipid panel   Collection Time: 11/07/22 12:50 PM  Result Value Ref Range   Cholesterol, Total 230 (H) 100 - 199 mg/dL   Triglycerides 65 0 - 149 mg/dL   HDL 92 >11 mg/dL   VLDL Cholesterol Cal 11 5 - 40 mg/dL   LDL Chol Calc (NIH) 914 (H) 0 - 99 mg/dL   Chol/HDL Ratio 2.5 0.0 - 4.4 ratio  VITAMIN D 25 Hydroxy (Vit-D Deficiency, Fractures)   Collection Time: 11/07/22 12:50 PM  Result Value Ref Range   Vit D, 25-Hydroxy 73.3 30.0 - 100.0 ng/mL  TSH   Collection Time: 11/07/22 12:50 PM  Result Value Ref Range   TSH 1.800 0.450 - 4.500 uIU/mL       Assessment & Plan:   Problem List Items Addressed This Visit     Gastroesophageal reflux disease   Chronic GERD exacerbated by late eating and lying down. Patient aware of necessary lifestyle modifications. - Discuss lifestyle modifications, including avoiding late meals and remaining upright after eating - Consider PRN famotidine for symptoms as they occur       Encounter for annual physical exam - Primary   CPE completed today. Review of HM activities and recommendations discussed and provided on AVS. Anticipatory guidance, diet, and exercise recommendations provided. Medications, allergies, and hx reviewed and updated as necessary. Orders placed as listed below.  Plan: - Labs ordered. Will make changes as necessary based on results.  - I will review these results and send recommendations via MyChart or a telephone call.  - F/U with CPE in 1 year or sooner for acute/chronic health needs as directed.        Relevant Orders   CBC with Differential/Platelet   Comprehensive metabolic panel   Lipid panel   VITAMIN D 25 Hydroxy (Vit-D Deficiency, Fractures)   Vitamin B12   Hemoglobin A1c   Neck pain, chronic  Chronic neck and upper back pain, likely cervical spondylosis. Pain exacerbated by prolonged standing  and right arm use. Possible nerve involvement causing burning sensation. Discussed potential degenerative changes or nerve impingement, benefits of chiropractic care, and specialist referral if symptoms persist. - Provide neck stretches - Recommend frequent chiropractic visits - Consider referral to orthopedic or spinal specialist if symptoms persist      Relevant Orders   CBC with Differential/Platelet   Comprehensive metabolic panel   Lipid panel   VITAMIN D 25 Hydroxy (Vit-D Deficiency, Fractures)   Vitamin B12   Hemoglobin A1c   Vision changes   Reports cloudy vision and floaters. Last eye exam 2 years ago. Possible cataracts. Discussed high success rate and quick recovery of cataract surgery. - Recommend follow-up with ophthalmologist      Weight decreasing   Excellent success with lifestyle shift and metabolic reset. Recommend continue with current plan and activities to help maintain loss.      Osteoporosis without current pathological fracture   Relevant Orders   CBC with Differential/Platelet   Comprehensive metabolic panel   Lipid panel   VITAMIN D 25 Hydroxy (Vit-D Deficiency, Fractures)   Vitamin B12   Hemoglobin A1c   Other Visit Diagnoses       Screening for endocrine, nutritional, metabolic and immunity disorder       Relevant Orders   CBC with Differential/Platelet   Comprehensive metabolic panel   Lipid panel   VITAMIN D 25 Hydroxy (Vit-D Deficiency, Fractures)   Vitamin B12   Hemoglobin A1c     Screening for lipid disorders       Relevant Orders   CBC with Differential/Platelet   Comprehensive metabolic panel   Lipid panel   VITAMIN D 25 Hydroxy (Vit-D Deficiency, Fractures)   Vitamin B12   Hemoglobin A1c         Follow up plan: Return in about 1 year (around 11/07/2024).  NEXT PREVENTATIVE PHYSICAL DUE IN 1 YEAR.  PATIENT COUNSELING PROVIDED FOR ALL ADULT PATIENTS: A well balanced diet low in saturated fats, cholesterol, and moderation in  carbohydrates.  This can be as simple as monitoring portion sizes and cutting back on sugary beverages such as soda and juice to start with.    Daily water consumption of at least 64 ounces.  Physical activity at least 180 minutes per week.  If just starting out, start 10 minutes a day and work your way up.   This can be as simple as taking the stairs instead of the elevator and walking 2-3 laps around the office  purposefully every day.   STD protection, partner selection, and regular testing if high risk.  Limited consumption of alcoholic beverages if alcohol is consumed. For men, I recommend no more than 14 alcoholic beverages per week, spread out throughout the week (max 2 per day). Avoid "binge" drinking or consuming large quantities of alcohol in one setting.  Please let me know if you feel you may need help with reduction or quitting alcohol consumption.   Avoidance of nicotine, if used. Please let me know if you feel you may need help with reduction or quitting nicotine use.   Daily mental health attention. This can be in the form of 5 minute daily meditation, prayer, journaling, yoga, reflection, etc.  Purposeful attention to your emotions and mental state can significantly improve your overall wellbeing  and  Health.  Please know that I am here to help you with all of your health care goals  and am happy to work with you to find a solution that works best for you.  The greatest advice I have received with any changes in life are to take it one step at a time, that even means if all you can focus on is the next 60 seconds, then do that and celebrate your victories.  With any changes in life, you will have set backs, and that is OK. The important thing to remember is, if you have a set back, it is not a failure, it is an opportunity to try again! Screening Testing Mammogram Every 1 -2 years based on history and risk factors Starting at age 23 Pap Smear Ages 21-39 every 3  years Ages 34-65 every 5 years with HPV testing More frequent testing may be required based on results and history Colon Cancer Screening Every 1-10 years based on test performed, risk factors, and history Starting at age 58 Bone Density Screening Every 2-10 years based on history Starting at age 65 for women Recommendations for men differ based on medication usage, history, and risk factors AAA Screening One time ultrasound Men 39-31 years old who have every smoked Lung Cancer Screening Low Dose Lung CT every 12 months Age 81-80 years with a 30 pack-year smoking history who still smoke or who have quit within the last 15 years   Screening Labs Routine  Labs: Complete Blood Count (CBC), Complete Metabolic Panel (CMP), Cholesterol (Lipid Panel) Every 6-12 months based on history and medications May be recommended more frequently based on current conditions or previous results Hemoglobin A1c Lab Every 3-12 months based on history and previous results Starting at age 36 or earlier with diagnosis of diabetes, high cholesterol, BMI >26, and/or risk factors Frequent monitoring for patients with diabetes to ensure blood sugar control Thyroid Panel (TSH) Every 6 months based on history, symptoms, and risk factors May be repeated more often if on medication HIV One time testing for all patients 21 and older May be repeated more frequently for patients with increased risk factors or exposure Hepatitis C One time testing for all patients 30 and older May be repeated more frequently for patients with increased risk factors or exposure Gonorrhea, Chlamydia Every 12 months for all sexually active persons 13-24 years Additional monitoring may be recommended for those who are considered high risk or who have symptoms Every 12 months for any woman on birth control, regardless of sexual activity PSA Men 10-24 years old with risk factors Additional screening may be recommended from age 41-69  based on risk factors, symptoms, and history  Vaccine Recommendations Tetanus Booster All adults every 10 years Flu Vaccine All patients 6 months and older every year COVID Vaccine All patients 12 years and older Initial dosing with booster May recommend additional booster based on age and health history HPV Vaccine 2 doses all patients age 33-26 Dosing may be considered for patients over 26 Shingles Vaccine (Shingrix) 2 doses all adults 55 years and older Pneumonia (Pneumovax 59) All adults 65 years and older May recommend earlier dosing based on health history One year apart from Prevnar 78 Pneumonia (Prevnar 31) All adults 65 years and older Dosed 1 year after Pneumovax 23 Pneumonia (Prevnar 20) One time alternative to the two dosing of 13 and 23 For all adults with initial dose of 23, 20 is recommended 1 year later For all adults with initial dose of 13, 23 is still recommended as second option 1 year later

## 2023-11-08 NOTE — Progress Notes (Signed)
Patient asking for a test for her blood type to be confirmed.

## 2023-11-08 NOTE — Patient Instructions (Signed)
I am so proud of you!! You are doing amazing.   Cervical Radiculopathy  Cervical radiculopathy happens when a nerve in the neck (a cervical nerve) is pinched or bruised. This condition can happen because of an injury to the cervical spine (vertebrae) in the neck, or as part of the normal aging process. Pressure on the cervical nerves can cause pain or numbness that travels from the neck all the way down to the arm and fingers. This condition usually gets better with rest. Treatment may be needed if the condition does not improve. What are the causes? This condition may be caused by: A neck injury. A bulging (herniated) disk. Muscle spasms. Muscle tightness in the neck due to overuse. Arthritis. Breakdown or degeneration in the bones and joints of the spine (spondylosis) due to aging. Bone spurs that may develop near the cervical nerves. What are the signs or symptoms? Symptoms of this condition include: Pain. The pain may travel from the neck to the arm and hand. The pain can be severe or irritating. It may get worse when you move your neck. Numbness or tingling in your arm or hand. Weakness in the affected arm and hand, in severe cases. How is this diagnosed? This condition may be diagnosed based on your symptoms, your medical history, and a physical exam. You may also have tests, including: X-rays. CT scan. MRI. Electromyogram (EMG). Nerve conduction tests. How is this treated? In many cases, treatment is not needed for this condition. With rest, the condition usually gets better over time. If treatment is needed, options may include: Wearing a soft neck collar (cervical collar) for short periods of time. Doing physical therapy to strengthen your neck muscles. Taking medicines. These may include NSAIDs, such as ibuprofen, or oral corticosteroids. Having spinal injections, in severe cases. Having surgery. This may be needed if other treatments do not help. Different types of surgery  may be done depending on the cause of this condition. Follow these instructions at home: If you have a cervical collar: Wear it as told by your health care provider. Remove it only as told by your health care provider. Ask your health care provider if you can remove the cervical collar for cleaning and bathing. If you are allowed to remove the collar for cleaning or bathing: Follow instructions from your health care provider about how to remove the collar safely. Clean the collar by wiping it with mild soap and water and drying it completely. Take out any removable pads in the collar every 1-2 days, and wash them by hand with soap and water. Let them air-dry completely before you put them back in the collar. Check your skin under the collar for irritation or sores. If you see any, tell your health care provider. Managing pain     Take over-the-counter and prescription medicines only as told by your health care provider. If directed, put ice on the affected area. To do this: If you have a soft neck collar, remove it as told by your health care provider. Put ice in a plastic bag. Place a towel between your skin and the bag. Leave the ice on for 20 minutes, 2-3 times a day. Remove the ice if your skin turns bright red. This is very important. If you cannot feel pain, heat, or cold, you have a greater risk of damage to the area. If applying ice does not help, you can try using heat. Use the heat source that your health care provider recommends, such as  a moist heat pack or a heating pad. Place a towel between your skin and the heat source. Leave the heat on for 20-30 minutes. Remove the heat if your skin turns bright red. This is especially important if you are unable to feel pain, heat, or cold. You have a greater risk of getting burned. Try a gentle neck and shoulder massage to help relieve symptoms. Activity Rest as needed. Return to your normal activities as told by your health care  provider. Ask your health care provider what activities are safe for you. Do stretching and strengthening exercises as told by your health care provider or your physical therapist. You may have to avoid lifting. Ask your health care provider how much you can safely lift. General instructions Use a flat pillow when you sleep. Do not drive while wearing a cervical collar. If you do not have a cervical collar, ask your health care provider if it is safe to drive while your neck heals. Ask your health care provider if the medicine prescribed to you requires you to avoid driving or using machinery. Do not use any products that contain nicotine or tobacco. These products include cigarettes, chewing tobacco, and vaping devices, such as e-cigarettes. If you need help quitting, ask your health care provider. Keep all follow-up visits. This is important. Contact a health care provider if: Your condition does not improve with treatment. Get help right away if: Your pain gets much worse and is not controlled with medicines. You have weakness or numbness in your hand, arm, face, or leg. You have a high fever. You have a stiff, rigid neck. You lose control of your bowels or your bladder (have incontinence). You have trouble with walking, balance, or speaking. Summary Cervical radiculopathy happens when a nerve in the neck is pinched or bruised. A nerve can get pinched from a bulging disk, arthritis, muscle spasms, or an injury to the neck. Symptoms include pain, tingling, or numbness radiating from the neck to the arm or hand. Weakness can also occur in severe cases. Treatment may include rest, wearing a cervical collar, and physical therapy. Medicines may be prescribed to help with pain. In severe cases, injections or surgery may be needed. This information is not intended to replace advice given to you by your health care provider. Make sure you discuss any questions you have with your health care  provider. Document Revised: 05/05/2021 Document Reviewed: 05/05/2021 Elsevier Patient Education  2024 [employer].

## 2023-11-08 NOTE — Assessment & Plan Note (Signed)
Excellent success with lifestyle shift and metabolic reset. Recommend continue with current plan and activities to help maintain loss.

## 2023-11-08 NOTE — Assessment & Plan Note (Signed)
Chronic neck and upper back pain, likely cervical spondylosis. Pain exacerbated by prolonged standing and right arm use. Possible nerve involvement causing burning sensation. Discussed potential degenerative changes or nerve impingement, benefits of chiropractic care, and specialist referral if symptoms persist. - Provide neck stretches - Recommend frequent chiropractic visits - Consider referral to orthopedic or spinal specialist if symptoms persist

## 2023-11-08 NOTE — Assessment & Plan Note (Signed)
Reports cloudy vision and floaters. Last eye exam 2 years ago. Possible cataracts. Discussed high success rate and quick recovery of cataract surgery. - Recommend follow-up with ophthalmologist

## 2023-11-08 NOTE — Assessment & Plan Note (Signed)
Chronic GERD exacerbated by late eating and lying down. Patient aware of necessary lifestyle modifications. - Discuss lifestyle modifications, including avoiding late meals and remaining upright after eating - Consider PRN famotidine for symptoms as they occur

## 2023-11-08 NOTE — Assessment & Plan Note (Signed)

## 2023-11-09 LAB — HEMOGLOBIN A1C
Est. average glucose Bld gHb Est-mCnc: 105 mg/dL
Hgb A1c MFr Bld: 5.3 % (ref 4.8–5.6)

## 2023-11-09 LAB — VITAMIN D 25 HYDROXY (VIT D DEFICIENCY, FRACTURES): Vit D, 25-Hydroxy: 62.3 ng/mL (ref 30.0–100.0)

## 2023-11-09 LAB — COMPREHENSIVE METABOLIC PANEL
ALT: 21 [IU]/L (ref 0–32)
AST: 25 [IU]/L (ref 0–40)
Albumin: 4.6 g/dL (ref 3.9–4.9)
Alkaline Phosphatase: 89 [IU]/L (ref 44–121)
BUN/Creatinine Ratio: 21 (ref 12–28)
BUN: 18 mg/dL (ref 8–27)
Bilirubin Total: 0.3 mg/dL (ref 0.0–1.2)
CO2: 25 mmol/L (ref 20–29)
Calcium: 9.7 mg/dL (ref 8.7–10.3)
Chloride: 105 mmol/L (ref 96–106)
Creatinine, Ser: 0.86 mg/dL (ref 0.57–1.00)
Globulin, Total: 2.6 g/dL (ref 1.5–4.5)
Glucose: 89 mg/dL (ref 70–99)
Potassium: 4.1 mmol/L (ref 3.5–5.2)
Sodium: 145 mmol/L — ABNORMAL HIGH (ref 134–144)
Total Protein: 7.2 g/dL (ref 6.0–8.5)
eGFR: 74 mL/min/{1.73_m2} (ref >59)

## 2023-11-09 LAB — CBC WITH DIFFERENTIAL/PLATELET
Basophils Absolute: 0 10*3/uL (ref 0.0–0.2)
Basos: 1 %
EOS (ABSOLUTE): 0 10*3/uL (ref 0.0–0.4)
Eos: 1 %
Hematocrit: 40.4 % (ref 34.0–46.6)
Hemoglobin: 12.8 g/dL (ref 11.1–15.9)
Immature Grans (Abs): 0 10*3/uL (ref 0.0–0.1)
Immature Granulocytes: 0 %
Lymphocytes Absolute: 1.2 10*3/uL (ref 0.7–3.1)
Lymphs: 45 %
MCH: 27.2 pg (ref 26.6–33.0)
MCHC: 31.7 g/dL (ref 31.5–35.7)
MCV: 86 fL (ref 79–97)
Monocytes Absolute: 0.3 10*3/uL (ref 0.1–0.9)
Monocytes: 11 %
Neutrophils Absolute: 1.1 10*3/uL — ABNORMAL LOW (ref 1.4–7.0)
Neutrophils: 42 %
Platelets: 325 10*3/uL (ref 150–450)
RBC: 4.71 x10E6/uL (ref 3.77–5.28)
RDW: 12.8 % (ref 11.7–15.4)
WBC: 2.6 10*3/uL — ABNORMAL LOW (ref 3.4–10.8)

## 2023-11-09 LAB — LIPID PANEL
Chol/HDL Ratio: 2.9 {ratio} (ref 0.0–4.4)
Cholesterol, Total: 247 mg/dL — ABNORMAL HIGH (ref 100–199)
HDL: 85 mg/dL (ref >39)
LDL Chol Calc (NIH): 152 mg/dL — ABNORMAL HIGH (ref 0–99)
Triglycerides: 64 mg/dL (ref 0–149)
VLDL Cholesterol Cal: 10 mg/dL (ref 5–40)

## 2023-11-09 LAB — VITAMIN B12: Vitamin B-12: 734 pg/mL (ref 232–1245)

## 2023-11-16 ENCOUNTER — Other Ambulatory Visit: Payer: Self-pay

## 2023-11-16 DIAGNOSIS — D729 Disorder of white blood cells, unspecified: Secondary | ICD-10-CM

## 2023-11-26 ENCOUNTER — Encounter: Payer: Self-pay | Admitting: Nurse Practitioner

## 2023-11-27 ENCOUNTER — Ambulatory Visit: Payer: Medicare PPO | Admitting: Medical

## 2023-11-27 VITALS — BP 112/62 | HR 75 | Temp 97.5°F | Resp 16 | Wt 157.4 lb

## 2023-11-27 DIAGNOSIS — R051 Acute cough: Secondary | ICD-10-CM

## 2023-11-27 DIAGNOSIS — U071 COVID-19: Secondary | ICD-10-CM

## 2023-11-27 DIAGNOSIS — J0300 Acute streptococcal tonsillitis, unspecified: Secondary | ICD-10-CM

## 2023-11-27 DIAGNOSIS — J029 Acute pharyngitis, unspecified: Secondary | ICD-10-CM

## 2023-11-27 LAB — POCT INFLUENZA A/B
Influenza A, POC: NEGATIVE
Influenza B, POC: NEGATIVE

## 2023-11-27 LAB — POCT RAPID STREP A (OFFICE): Rapid Strep A Screen: POSITIVE — AB

## 2023-11-27 LAB — POC COVID19 BINAXNOW: SARS Coronavirus 2 Ag: POSITIVE — AB

## 2023-11-27 MED ORDER — AMOXICILLIN 500 MG PO CAPS: 500.0000 mg | ORAL_CAPSULE | Freq: Three times a day (TID) | ORAL | 0 refills | Status: AC

## 2023-11-27 MED ORDER — EMERGEN-C IMMUNE PLUS PO PACK
1.0000 | PACK | Freq: Two times a day (BID) | ORAL | 0 refills | Status: DC
Start: 2023-11-27 — End: 2024-07-21

## 2023-11-27 MED ORDER — HYDROCODONE BIT-HOMATROP MBR 5-1.5 MG/5ML PO SOLN
5.0000 mL | Freq: Three times a day (TID) | ORAL | 0 refills | Status: AC | PRN
Start: 2023-11-27 — End: 2023-12-02

## 2023-11-27 MED ORDER — NIRMATRELVIR/RITONAVIR (PAXLOVID)TABLET: 3.0000 | ORAL_TABLET | Freq: Two times a day (BID) | ORAL | 0 refills | Status: AC

## 2023-11-27 MED ORDER — ALBUTEROL SULFATE HFA 108 (90 BASE) MCG/ACT IN AERS
2.0000 | INHALATION_SPRAY | Freq: Four times a day (QID) | RESPIRATORY_TRACT | 0 refills | Status: DC | PRN
Start: 2023-11-27 — End: 2023-12-19

## 2023-11-27 NOTE — Progress Notes (Signed)
 Subjective:  Tina Harris is a 68 y.o. female who presents for Chief Complaint  Patient presents with   URI    Been sick since 1/1. Took covid test and was negative. HA, Cough, sore throat, fatigue, body aches, chills, not sure of fever     Here for illness.  Started feeling bad 11/14/23.  She was around lots of family for new years, and there were some sick contacts there.  initially had a variety of symptoms including cough, sore throat, headache, fatigue.   On 11/18/22 actually felt improvement, went to work.  However that day at work was around numerous people, some sick.  Within days felt horrible.  Current symptoms include really bad cough particularly since 3 days ago, ongoing fevers, body aches and chills, sore throat, fatigue.  Just feels awful in general.  Also noticed a little shortness of breath.  Using over-the-counter Mucinex and then switch to Alka-Seltzer.  Non-smoker.  Not on any medicines regularly  No other aggravating or relieving factors.    No other c/o.  Past Medical History:  Diagnosis Date   GERD (gastroesophageal reflux disease)    History of colon polyps    No current outpatient medications on file prior to visit.   No current facility-administered medications on file prior to visit.     The following portions of the patient's history were reviewed and updated as appropriate: allergies, current medications, past family history, past medical history, past social history, past surgical history and problem list.  ROS Otherwise as in subjective above    Objective: BP 112/62   Pulse 75   Temp (!) 97.5 F (36.4 C)   Resp 16   Wt 157 lb 6.4 oz (71.4 kg)   SpO2 98%   BMI 26.19 kg/m   General appearance: alert, no distress, well developed, well nourished HEENT: normocephalic, sclerae anicteric, conjunctiva pink and moist, nares patent, no discharge ,+ erythema, pharynx with moderate erythema and swollen erythematous tonsils Oral cavity: MMM, no  lesions Neck: supple, shoddy tender anterior nodes, no thyromegaly, no masses Heart: RRR, normal S1, S2, no murmurs Lungs: somewhat decreased breath sounds, but no wheezes, rhonchi, or rales Pulses: 2+ radial pulses, 2+ pedal pulses, normal cap refill Ext: no edema   Assessment: Encounter Diagnoses  Name Primary?   Sore throat Yes   Acute cough    COVID    Strep tonsillitis      Plan: We discussed symptoms and concerns,  Exam findings, and positive findings of COVID-positive and strep positive on 6 screenings today  Discussed the following recommendations, treatment, possible complications and follow-up.  Discussed quarantine measures as well  Covid infection, covid illness general recommendations:  I recommend you rest, hydrate well with water and clear fluids throughout the day.  If you feel dry in the mouth, tongue or feel that you are urinating as much as usual, then increase hydration.  You urine should be like yellow to clear, not dark yellow or darker.     Pain, body aches, or fever: You can use Tylenol /Acetaminophen 325mg  over the counter for pain or fever, every 4-6 hours   Cough: You can continue the counter alka seltzer as needed   You can use Hycodan prescription medicaiton for worse cough.   This medication can cause drowsiness so use caution. Don't take this medication and drive or operate machinery.   Given the + strep finding, begin Amoxicillin  antibiotic as well.      Shortness of breath or  wheezing: I prescribed an albuterol  inhaler today to help with these symptoms.  You can use 1-2 puffs every 4 hours as needed for wheezing, shortness of breath, chest tightness, or coughing fits.  Caution as this medication can cause jitteriness/shakes.    Nausea: You can use over the counter Emetrol for nausea.      Antiviral medication: Begin medication Paxlovid  to help reduce your risk of hospitalization or severe illness.  If medicaiton is not available,  too expensive or not covered by insurance, then call us  back.   Other supportive measures: I recommend extra vitamins to help your body fight the illness.   Consider over the counter vitamin pack such as EmergenC Immune Plus which contains extra vitamin C, vitamin D  and zinc.     In the next few days, if you are having trouble breathing, if you are very weak, have high fever 103 or higher consistently despite Tylenol, or uncontrollable nausea and vomiting, then call or go to the emergency department.    If you have other questions or have other symptoms or questions you are concerned about then please make a virtual visit   Covid symptoms such as fatigue and cough can linger over 2 weeks, even after the initial fever, aches, chills, and other initial symptoms.   Self Quarantine: The CDC, Centers for Disease Control has recommended a self quarantine of 5 days from the start of your illness until you are symptom-free including at least 24 hours of no symptoms including no fever, no shortness of breath, and no body aches and chills, by day 5 before returning to work or general contact with the public.  What does self quarantine mean: avoiding contact with people as much as possible.   Particularly in your house, isolate your self from others in a separate room, wear a mask when possible in the room, particularly if coughing a lot.   Have others bring food, water, medications, etc., to your door, but avoid direct contact with your household contacts during this time to avoid spreading the infection to them.   If you have a separate bathroom and living quarters during the next 2 weeks away from others, that would be preferable.    If you can't completely isolate, then wear a mask, wash hands frequently with soap and water for at least 15 seconds, minimize close contact with others, and have a friend or family member check regularly from a distance to make sure you are not getting seriously worse.      You should not be going out in public, should not be going to stores, to work or other public places until all your symptoms have resolved and at least 5 days + 24 hours of no symptoms at all have transpired.   Ideally you should avoid contact with others for a full 5 days if possible.  One of the goals is to limit spread to high risk people; people that are older and elderly, people with multiple health issues like diabetes, heart disease, lung disease, and anybody that has weakened immune systems such as people with cancer or on immunosuppressive therapy.   Tanveer was seen today for uri.  Diagnoses and all orders for this visit:  Sore throat -     Rapid Strep A -     POC COVID-19 -     Influenza A/B  Acute cough -     POC COVID-19 -     Influenza A/B  COVID  Strep tonsillitis  Other orders -     amoxicillin  (AMOXIL ) 500 MG capsule; Take 1 capsule (500 mg total) by mouth 3 (three) times daily for 10 days. -     albuterol  (VENTOLIN  HFA) 108 (90 Base) MCG/ACT inhaler; Inhale 2 puffs into the lungs every 6 (six) hours as needed for wheezing or shortness of breath. -     Multiple Vitamins-Minerals (EMERGEN-C IMMUNE PLUS) PACK; Take 1 tablet by mouth 2 (two) times daily. -     nirmatrelvir /ritonavir  (PAXLOVID ) 20 x 150 MG & 10 x 100MG  TABS; Take 3 tablets by mouth 2 (two) times daily for 5 days. (Take nirmatrelvir  150 mg two tablets twice daily for 5 days and ritonavir  100 mg one tablet twice daily for 5 days) Patient GFR is normal, no kidney disease -     HYDROcodone  bit-homatropine (HYCODAN) 5-1.5 MG/5ML syrup; Take 5 mLs by mouth every 8 (eight) hours as needed for up to 5 days for cough.    Follow up: prn

## 2023-11-27 NOTE — Patient Instructions (Signed)
 Covid infection, covid illness general recommendations:  I recommend you rest, hydrate well with water and clear fluids throughout the day.  If you feel dry in the mouth, tongue or feel that you are urinating as much as usual, then increase hydration.  You urine should be like yellow to clear, not dark yellow or darker.     Pain, body aches, or fever: You can use Tylenol /Acetaminophen 325mg  over the counter for pain or fever, every 4-6 hours   Cough: You can continue the counter alka seltzer as needed   You can use Hycodan prescription medicaiton for worse cough.   This medication can cause drowsiness so use caution. Don't take this medication and drive or operate machinery.   Given the + strep finding, begin Amoxicillin  antibiotic as well.      Shortness of breath or wheezing: I prescribed an albuterol  inhaler today to help with these symptoms.  You can use 1-2 puffs every 4 hours as needed for wheezing, shortness of breath, chest tightness, or coughing fits.  Caution as this medication can cause jitteriness/shakes.    Nausea: You can use over the counter Emetrol for nausea.      Antiviral medication: Begin medication Paxlovid  to help reduce your risk of hospitalization or severe illness.  If medicaiton is not available, too expensive or not covered by insurance, then call us  back.   Other supportive measures: I recommend extra vitamins to help your body fight the illness.   Consider over the counter vitamin pack such as EmergenC Immune Plus which contains extra vitamin C, vitamin D  and zinc.     In the next few days, if you are having trouble breathing, if you are very weak, have high fever 103 or higher consistently despite Tylenol, or uncontrollable nausea and vomiting, then call or go to the emergency department.    If you have other questions or have other symptoms or questions you are concerned about then please make a virtual visit   Covid symptoms such as fatigue  and cough can linger over 2 weeks, even after the initial fever, aches, chills, and other initial symptoms.   Self Quarantine: The CDC, Centers for Disease Control has recommended a self quarantine of 5 days from the start of your illness until you are symptom-free including at least 24 hours of no symptoms including no fever, no shortness of breath, and no body aches and chills, by day 5 before returning to work or general contact with the public.  What does self quarantine mean: avoiding contact with people as much as possible.   Particularly in your house, isolate your self from others in a separate room, wear a mask when possible in the room, particularly if coughing a lot.   Have others bring food, water, medications, etc., to your door, but avoid direct contact with your household contacts during this time to avoid spreading the infection to them.   If you have a separate bathroom and living quarters during the next 2 weeks away from others, that would be preferable.    If you can't completely isolate, then wear a mask, wash hands frequently with soap and water for at least 15 seconds, minimize close contact with others, and have a friend or family member check regularly from a distance to make sure you are not getting seriously worse.     You should not be going out in public, should not be going to stores, to work or other public places until all your  symptoms have resolved and at least 5 days + 24 hours of no symptoms at all have transpired.   Ideally you should avoid contact with others for a full 5 days if possible.  One of the goals is to limit spread to high risk people; people that are older and elderly, people with multiple health issues like diabetes, heart disease, lung disease, and anybody that has weakened immune systems such as people with cancer or on immunosuppressive therapy.

## 2023-12-13 ENCOUNTER — Other Ambulatory Visit (HOSPITAL_BASED_OUTPATIENT_CLINIC_OR_DEPARTMENT_OTHER): Payer: Self-pay | Admitting: Nurse Practitioner

## 2023-12-13 DIAGNOSIS — Z1231 Encounter for screening mammogram for malignant neoplasm of breast: Secondary | ICD-10-CM

## 2023-12-18 ENCOUNTER — Inpatient Hospital Stay: Payer: Medicare PPO

## 2023-12-18 ENCOUNTER — Encounter (HOSPITAL_BASED_OUTPATIENT_CLINIC_OR_DEPARTMENT_OTHER): Payer: Self-pay | Admitting: Radiology

## 2023-12-18 ENCOUNTER — Ambulatory Visit (HOSPITAL_BASED_OUTPATIENT_CLINIC_OR_DEPARTMENT_OTHER)
Admission: RE | Admit: 2023-12-18 | Discharge: 2023-12-18 | Disposition: A | Discharge status: Home / Self Care | Payer: Medicare PPO | Source: Ambulatory Visit | Attending: Nurse Practitioner | Admitting: Nurse Practitioner

## 2023-12-18 ENCOUNTER — Encounter: Payer: Self-pay | Admitting: Oncology

## 2023-12-18 ENCOUNTER — Inpatient Hospital Stay: Payer: Medicare PPO | Attending: Oncology | Admitting: Oncology

## 2023-12-18 VITALS — BP 113/67 | HR 70 | Temp 98.1°F | Resp 18 | Ht 65.0 in | Wt 152.1 lb

## 2023-12-18 DIAGNOSIS — Z860100 Personal history of colon polyps, unspecified: Secondary | ICD-10-CM

## 2023-12-18 DIAGNOSIS — D72819 Decreased white blood cell count, unspecified: Secondary | ICD-10-CM | POA: Diagnosis not present

## 2023-12-18 DIAGNOSIS — K219 Gastro-esophageal reflux disease without esophagitis: Secondary | ICD-10-CM | POA: Diagnosis not present

## 2023-12-18 DIAGNOSIS — Z1231 Encounter for screening mammogram for malignant neoplasm of breast: Secondary | ICD-10-CM | POA: Diagnosis not present

## 2023-12-18 DIAGNOSIS — Z8616 Personal history of COVID-19: Secondary | ICD-10-CM | POA: Diagnosis not present

## 2023-12-18 DIAGNOSIS — Z0000 Encounter for general adult medical examination without abnormal findings: Secondary | ICD-10-CM

## 2023-12-18 LAB — IRON AND TIBC
Iron: 81 ug/dL (ref 28–170)
Saturation Ratios: 23 % (ref 10.4–31.8)
TIBC: 347 ug/dL (ref 250–450)
UIBC: 266 ug/dL

## 2023-12-18 LAB — COMPREHENSIVE METABOLIC PANEL
ALT: 10 U/L (ref 0–44)
AST: 14 U/L — ABNORMAL LOW (ref 15–41)
Albumin: 4.6 g/dL (ref 3.5–5.0)
Alkaline Phosphatase: 70 U/L (ref 38–126)
Anion gap: 6 (ref 5–15)
BUN: 23 mg/dL (ref 8–23)
CO2: 31 mmol/L (ref 22–32)
Calcium: 9.9 mg/dL (ref 8.9–10.3)
Chloride: 102 mmol/L (ref 98–111)
Creatinine, Ser: 0.85 mg/dL (ref 0.44–1.00)
GFR, Estimated: >60 mL/min (ref >60)
Glucose, Bld: 85 mg/dL (ref 70–99)
Potassium: 3.6 mmol/L (ref 3.5–5.1)
Sodium: 139 mmol/L (ref 135–145)
Total Bilirubin: 0.5 mg/dL (ref 0.0–1.2)
Total Protein: 8.1 g/dL (ref 6.5–8.1)

## 2023-12-18 LAB — CBC WITH DIFFERENTIAL/PLATELET
Abs Immature Granulocytes: 0 10*3/uL (ref 0.00–0.07)
Basophils Absolute: 0 10*3/uL (ref 0.0–0.1)
Basophils Relative: 1 %
Eosinophils Absolute: 0 10*3/uL (ref 0.0–0.5)
Eosinophils Relative: 1 %
HCT: 39.6 % (ref 36.0–46.0)
Hemoglobin: 13.3 g/dL (ref 12.0–15.0)
Immature Granulocytes: 0 %
Lymphocytes Relative: 41 %
Lymphs Abs: 1.2 10*3/uL (ref 0.7–4.0)
MCH: 28 pg (ref 26.0–34.0)
MCHC: 33.6 g/dL (ref 30.0–36.0)
MCV: 83.4 fL (ref 80.0–100.0)
Monocytes Absolute: 0.3 10*3/uL (ref 0.1–1.0)
Monocytes Relative: 10 %
Neutro Abs: 1.4 10*3/uL — ABNORMAL LOW (ref 1.7–7.7)
Neutrophils Relative %: 47 %
Platelets: 331 10*3/uL (ref 150–400)
RBC: 4.75 MIL/uL (ref 3.87–5.11)
RDW: 12.5 % (ref 11.5–15.5)
WBC: 3 10*3/uL — ABNORMAL LOW (ref 4.0–10.5)
nRBC: 0 % (ref 0.0–0.2)

## 2023-12-18 LAB — HEPATITIS PANEL, ACUTE
HCV Ab: NONREACTIVE
Hep A IgM: NONREACTIVE
Hep B C IgM: NONREACTIVE
Hepatitis B Surface Ag: NONREACTIVE

## 2023-12-18 LAB — FERRITIN: Ferritin: 103 ng/mL (ref 11–307)

## 2023-12-18 LAB — HIV ANTIBODY (ROUTINE TESTING W REFLEX): HIV Screen 4th Generation wRfx: NONREACTIVE

## 2023-12-18 LAB — FOLATE: Folate: 11.5 ng/mL (ref >5.9)

## 2023-12-18 LAB — LACTATE DEHYDROGENASE: LDH: 162 U/L (ref 98–192)

## 2023-12-18 NOTE — Assessment & Plan Note (Signed)
 Chronic GERD exacerbated by late-night eating. Discontinued Nexium due to osteopenia concerns. Currently managing symptoms through dietary modifications. Discussed avoiding late-night eating and high-fat foods before bedtime. Consider re-evaluating pharmacological intervention if symptoms persist.

## 2023-12-18 NOTE — Progress Notes (Signed)
 [facility]  HEMATOLOGY CLINIC CONSULTATION NOTE   PATIENT NAME: Tina Harris   MR#: [id number] DOB: [birth year]  DATE OF SERVICE: 12/18/2023   REFERRING PHYSICIAN  Early, Camie BRAVO, NP   Patient Care Team: Early, Camie BRAVO, NP as PCP - General (Nurse Practitioner)   REASON FOR CONSULTATION/ CHIEF COMPLAINT:  Leukopenia  ASSESSMENT & PLAN:  ISADORE BOKHARI is a 68 y.o. 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 No immature or abnormal cells observed.   Differential includes vitamin B12 deficiency, iron deficiency, folic acid  deficiency, autoimmune conditions, thyroid  dysfunction, viral infections (EBV, HIV, hepatitis), and benign neutropenia. No red flags such as abnormal red cell or platelet counts, or signs of bone marrow pathology.   Recent viral infections (COVID-19, strep throat) could contribute.   Explained that viral infections can cause low WBC and vice versa.   Some individuals live with benign neutropenia without significant issues. Current findings do not indicate a serious underlying condition.  Labs today showed slightly improved white count of 3000 with ANC of 1400, normal differential.  Hemoglobin normal at 13.3, MCV 3.4.  Platelet count normal at 331,000.  CMP unremarkable.  LDH within normal limits.  Today we pursued additional workup including iron studies, B12, folate, methylmalonic acid, TSH.  Will also check hepatitis panel, HIV, EBV, ANA, rheumatoid factor, flow cytometry of peripheral blood to rule out other etiologies.  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  Provided reassurance.  We will arrange phone call visit in two weeks to discuss results of remaining workup.  - Schedule follow-up appointment in  three months for repeat blood work  - Advise to report any unexplained fevers, chills, night sweats, or recurrent infections  Gastroesophageal reflux disease Chronic GERD exacerbated by late-night eating. Discontinued Nexium due to osteopenia concerns. Currently managing symptoms through dietary modifications. Discussed avoiding late-night eating and high-fat foods before bedtime. Consider re-evaluating pharmacological intervention if symptoms persist.  Healthcare maintenance Up to date on mammograms (she is getting it every two years) and colonoscopy (last in April 2023 with benign polyp removal and diagnosis of diverticulosis).  I reviewed lab results and outside records for this visit and discussed relevant results with the patient. Diagnosis, plan of care and treatment options were also discussed in detail with the patient. Opportunity provided to ask questions and answers provided to her apparent satisfaction. Provided instructions to call our clinic with any problems, questions or concerns prior to return visit. I recommended to continue follow-up with PCP and sub-specialists. She verbalized understanding and agreed with the plan. No barriers to learning was detected.  Chinita Patten, MD  12/18/2023 3:55 PM  [facility] - A DEPT OF [facility] [address] [place] [address] Dept: [phone] Dept Fax: [phone]   HISTORY OF PRESENT ILLNESS:  Discussed the use of AI scribe software for clinical note transcription with the patient, who gave verbal consent to proceed.   She mentions a significant weight loss of over thirty pounds and about thirty-two inches due to a metabolic reset program she started in July. The program involves a diet change to organic, natural foods, and a mindset change around food. She also reports a significant improvement in her overall  health and a decrease in joint pain since starting  the program.   On 11/08/2023, routine labs at her PCPs office showed white count of 2600, ANC of 1100, ALC of 1200, normal differential.  Hemoglobin was normal at 12.8, hematocrit 40.4, MCV 86.  Platelet count normal at 324,000.  Previously labs in December 2023 showed white count of 3000 with ANC of 1200, labs in December 2022 showed white count of 3400, ANC of 1400.  Given persistent leukopenia, referral was sent to us  for further evaluation.  She reports recent infections including strep throat and COVID-19 in January 2025. The patient was not aware of low white count issue until recently, although it has been present since at least 2022. The patient's white blood cell count has decreased from 3400 in 2022 to 2600 in the most recent test.   The patient has been following a strict diet and has lost a significant amount of weight. The patient also has GERD and has been trying to manage it naturally. The patient has been taking a variety of vitamin supplements since 2015 but stopped in August. The patient has not been having recurrent infections, fevers, chills, or night sweats. The patient's appetite is good and there are no unusual bleeding or bruising symptoms.   MEDICAL HISTORY Past Medical History:  Diagnosis Date   GERD (gastroesophageal reflux disease)    History of colon polyps      SURGICAL HISTORY Past Surgical History:  Procedure Laterality Date   COLONOSCOPY     2014-2015   UPPER GASTROINTESTINAL ENDOSCOPY       SOCIAL HISTORY: She reports that she has never smoked. She has never used smokeless tobacco. She reports current alcohol use. She reports that she does not use drugs. Social History   Socioeconomic History   Marital status: Widowed    Spouse name: Not on file   Number of children: Not on file   Years of education: Not on file   Highest education level: Not on file  Occupational History   Not on file  Tobacco Use   Smoking status: Never   Smokeless tobacco:  Never  Vaping Use   Vaping status: Never Used  Substance and Sexual Activity   Alcohol use: Yes    Comment: rarely   Drug use: Never   Sexual activity: Not Currently  Other Topics Concern   Not on file  Social History Narrative   Not on file   Social Drivers of Health   Financial Resource Strain: Low Risk  (04/24/2023)   Overall Financial Resource Strain (CARDIA)    Difficulty of Paying Living Expenses: Not hard at all  Food Insecurity: No Food Insecurity (12/18/2023)   Hunger Vital Sign    Worried About Running Out of Food in the Last Year: Never true    Ran Out of Food in the Last Year: Never true  Transportation Needs: No Transportation Needs (12/18/2023)   PRAPARE - Administrator, Civil Service (Medical): No    Lack of Transportation (Non-Medical): No  Physical Activity: Insufficiently Active (04/24/2023)   Exercise Vital Sign    Days of Exercise per Week: 4 days    Minutes of Exercise per Session: 20 min  Stress: No Stress Concern Present (04/24/2023)   [employer] of Occupational Health - Occupational Stress Questionnaire    Feeling of Stress : Not at all  Social Connections: Unknown (04/20/2023)   Social Connection and Isolation Panel [NHANES]    Frequency of Communication with  Friends and Family: More than three times a week    Frequency of Social Gatherings with Friends and Family: Three times a week    Attends Religious Services: Not on file    Active Member of Clubs or Organizations: No    Attends Banker Meetings: Never    Marital Status: Widowed  Intimate Partner Violence: Not At Risk (12/18/2023)   Humiliation, Afraid, Rape, and Kick questionnaire    Fear of Current or Ex-Partner: No    Emotionally Abused: No    Physically Abused: No    Sexually Abused: No    FAMILY HISTORY: Her family history includes Prostate cancer in her father.  CURRENT MEDICATIONS   Current Outpatient Medications  Medication Instructions   albuterol   (VENTOLIN  HFA) 108 (90 Base) MCG/ACT inhaler 2 puffs, Inhalation, Every 6 hours PRN   Multiple Vitamins-Minerals (EMERGEN-C IMMUNE PLUS) PACK 1 tablet, Oral, 2 times daily     ALLERGIES  She has no known allergies.  REVIEW OF SYSTEMS:  Review of Systems - Oncology   Rest of the pertinent review of systems is unremarkable except as mentioned above in HPI.  PHYSICAL EXAMINATION:   Onc Performance Status - 12/18/23 1414       ECOG Perf Status   ECOG Perf Status Fully active, able to carry on all pre-disease performance without restriction      KPS SCALE   KPS % SCORE Normal, no compliants, no evidence of disease             Vitals:   12/18/23 1405  BP: 113/67  Pulse: 70  Resp: 18  Temp: 98.1 F (36.7 C)  SpO2: 100%   Filed Weights   12/18/23 1405  Weight: 152 lb 1.6 oz (69 kg)    Physical Exam Constitutional:      General: She is not in acute distress.    Appearance: Normal appearance.  HENT:     Head: Normocephalic and atraumatic.  Eyes:     General: No scleral icterus.    Conjunctiva/sclera: Conjunctivae normal.  Cardiovascular:     Rate and Rhythm: Normal rate and regular rhythm.     Heart sounds: Normal heart sounds.  Pulmonary:     Effort: Pulmonary effort is normal.     Breath sounds: Normal breath sounds.  Abdominal:     General: There is no distension.     Palpations: Abdomen is soft. There is no mass.  Musculoskeletal:     Right lower leg: No edema.     Left lower leg: No edema.  Lymphadenopathy:     Cervical: No cervical adenopathy.  Neurological:     General: No focal deficit present.     Mental Status: She is alert and oriented to person, place, and time.  Psychiatric:        Mood and Affect: Mood normal.        Behavior: Behavior normal.        Thought Content: Thought content normal.    LABORATORY DATA:   I have reviewed the data as listed.  Results for orders placed or performed in visit on 12/18/23  Lactate dehydrogenase   Result Value Ref Range   LDH 162 98 - 192 U/L  Comprehensive metabolic panel  Result Value Ref Range   Sodium 139 135 - 145 mmol/L   Potassium 3.6 3.5 - 5.1 mmol/L   Chloride 102 98 - 111 mmol/L   CO2 31 22 - 32 mmol/L   Glucose, Bld 85 70 -  99 mg/dL   BUN 23 8 - 23 mg/dL   Creatinine, Ser 9.14 0.44 - 1.00 mg/dL   Calcium  9.9 8.9 - 10.3 mg/dL   Total Protein 8.1 6.5 - 8.1 g/dL   Albumin 4.6 3.5 - 5.0 g/dL   AST 14 (L) 15 - 41 U/L   ALT 10 0 - 44 U/L   Alkaline Phosphatase 70 38 - 126 U/L   Total Bilirubin 0.5 0.0 - 1.2 mg/dL   GFR, Estimated >39 >39 mL/min   Anion gap 6 5 - 15  CBC with Differential/Platelet  Result Value Ref Range   WBC 3.0 (L) 4.0 - 10.5 K/uL   RBC 4.75 3.87 - 5.11 MIL/uL   Hemoglobin 13.3 12.0 - 15.0 g/dL   HCT 60.3 63.9 - 53.9 %   MCV 83.4 80.0 - 100.0 fL   MCH 28.0 26.0 - 34.0 pg   MCHC 33.6 30.0 - 36.0 g/dL   RDW 87.4 88.4 - 84.4 %   Platelets 331 150 - 400 K/uL   nRBC 0.0 0.0 - 0.2 %   Neutrophils Relative % 47 %   Neutro Abs 1.4 (L) 1.7 - 7.7 K/uL   Lymphocytes Relative 41 %   Lymphs Abs 1.2 0.7 - 4.0 K/uL   Monocytes Relative 10 %   Monocytes Absolute 0.3 0.1 - 1.0 K/uL   Eosinophils Relative 1 %   Eosinophils Absolute 0.0 0.0 - 0.5 K/uL   Basophils Relative 1 %   Basophils Absolute 0.0 0.0 - 0.1 K/uL   Immature Granulocytes 0 %   Abs Immature Granulocytes 0.00 0.00 - 0.07 K/uL    RADIOGRAPHIC STUDIES:  No pertinent imaging studies available to review.  Orders Placed This Encounter  Procedures   CBC with Differential/Platelet    Standing Status:   Future    Number of Occurrences:   1    Expiration Date:   12/17/2024   Comprehensive metabolic panel    Standing Status:   Future    Number of Occurrences:   1    Expiration Date:   12/17/2024   Iron and TIBC    Standing Status:   Future    Number of Occurrences:   1    Expiration Date:   12/17/2024   Ferritin    Standing Status:   Future    Number of Occurrences:   1    Expiration  Date:   12/17/2024   Folate    Standing Status:   Future    Number of Occurrences:   1    Expiration Date:   12/17/2024   Lactate dehydrogenase    Standing Status:   Future    Number of Occurrences:   1    Expiration Date:   12/17/2024   ANA w/Reflex if Positive    Standing Status:   Future    Number of Occurrences:   1    Expiration Date:   12/17/2024   Rheumatoid factor    Standing Status:   Future    Number of Occurrences:   1    Expiration Date:   12/17/2024   Methylmalonic acid, serum    Standing Status:   Future    Number of Occurrences:   1    Expiration Date:   12/17/2024   Hepatitis panel, acute    Standing Status:   Future    Number of Occurrences:   1    Expiration Date:   12/17/2024   HIV antibody (with reflex)  Standing Status:   Future    Number of Occurrences:   1    Expiration Date:   12/17/2024   Epstein-Barr virus VCA, IgG    Standing Status:   Future    Number of Occurrences:   1    Expiration Date:   12/17/2024   Flow Cytometry, Peripheral Blood (Oncology)    Standing Status:   Future    Number of Occurrences:   1    Expiration Date:   12/17/2024    Future Appointments  Date Time Provider Department Center  01/01/2024  3:20 PM Autumn Millman, MD [facility] None  03/18/2024 11:15 AM [facility] PHLEBOTOMIST [facility] None  03/18/2024 11:40 AM Ader Fritze, Millman, MD [facility] None  04/29/2024  2:50 PM [facility]-ANNUAL WELLNESS VISIT [facility]  11/18/2024 10:45 AM Early, Camie BRAVO, NP [facility]    I spent a total of 55 minutes during this encounter with the patient including review of chart and various tests results, discussions about plan of care and coordination of care plan.  This document was completed utilizing speech recognition software. Grammatical errors, random word insertions, pronoun errors, and incomplete sentences are an occasional consequence of this system due to software limitations, ambient noise, and hardware issues. Any formal questions or concerns about the content,  text or information contained within the body of this dictation should be directly addressed to the provider for clarification.

## 2023-12-18 NOTE — Assessment & Plan Note (Addendum)
 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 No immature or abnormal cells observed.   Differential includes vitamin B12 deficiency, iron deficiency, folic acid  deficiency, autoimmune conditions, thyroid  dysfunction, viral infections (EBV, HIV, hepatitis), and benign neutropenia. No red flags such as abnormal red cell or platelet counts, or signs of bone marrow pathology.   Recent viral infections (COVID-19, strep throat) could contribute.   Explained that viral infections can cause low WBC and vice versa.   Some individuals live with benign neutropenia without significant issues. Current findings do not indicate a serious underlying condition.  Labs today showed slightly improved white count of 3000 with ANC of 1400, normal differential.  Hemoglobin normal at 13.3, MCV 3.4.  Platelet count normal at 331,000.  CMP unremarkable.  LDH within normal limits.  Today we pursued additional workup including iron studies, B12, folate, methylmalonic acid, TSH.  Will also check hepatitis panel, HIV, EBV, ANA, rheumatoid factor, flow cytometry of peripheral blood to rule out other etiologies.  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  Provided reassurance.  We will arrange phone call visit in two weeks to discuss results of remaining workup.  - Schedule follow-up appointment in three months for repeat blood work  - Advise to report any unexplained fevers, chills, night sweats, or recurrent infections

## 2023-12-18 NOTE — Assessment & Plan Note (Signed)
Up to date on mammograms (she is getting it every two years) and colonoscopy (last in April 2023 with benign polyp removal and diagnosis of diverticulosis).

## 2023-12-19 ENCOUNTER — Other Ambulatory Visit: Payer: Self-pay

## 2023-12-19 ENCOUNTER — Telehealth: Payer: Self-pay | Admitting: Nurse Practitioner

## 2023-12-19 ENCOUNTER — Other Ambulatory Visit: Payer: Self-pay | Admitting: Medical

## 2023-12-19 LAB — ANA W/REFLEX IF POSITIVE: Anti Nuclear Antibody (ANA): NEGATIVE

## 2023-12-19 LAB — RHEUMATOID FACTOR: Rheumatoid fact SerPl-aCnc: 273.8 [IU]/mL — ABNORMAL HIGH (ref <14.0)

## 2023-12-19 LAB — EPSTEIN-BARR VIRUS VCA, IGG: EBV VCA IgG: >600 U/mL — ABNORMAL HIGH (ref 0.0–17.9)

## 2023-12-19 LAB — SURGICAL PATHOLOGY

## 2023-12-19 NOTE — Telephone Encounter (Signed)
 Pt called concerned about Albuterol  rx that [facility] notified her was ready for pick up, advised pt that we received refill request from [facility] and it was probably an automated request since she was prescribed albuterol   1/ 2025 when she was seen for illness. Told her to just let [facility] know she does not want medication and she asked to have med removed from her meds list and I advised her that I would ask cma to remove or document that she is not taking the albuterol 

## 2023-12-20 LAB — METHYLMALONIC ACID, SERUM: Methylmalonic Acid, Quantitative: 142 nmol/L (ref 0–378)

## 2023-12-21 ENCOUNTER — Telehealth: Payer: Self-pay | Admitting: *Deleted

## 2023-12-21 LAB — FLOW CYTOMETRY

## 2023-12-21 NOTE — Telephone Encounter (Signed)
 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 Asking for MD or nurse to call her to discuss this. Forwarded message to Dr. Randye Buttner.

## 2023-12-24 ENCOUNTER — Encounter: Payer: Self-pay | Admitting: Nurse Practitioner

## 2023-12-25 ENCOUNTER — Encounter: Payer: Self-pay | Admitting: Oncology

## 2023-12-25 ENCOUNTER — Inpatient Hospital Stay (HOSPITAL_BASED_OUTPATIENT_CLINIC_OR_DEPARTMENT_OTHER): Payer: Medicare PPO | Admitting: Oncology

## 2023-12-25 DIAGNOSIS — K219 Gastro-esophageal reflux disease without esophagitis: Secondary | ICD-10-CM

## 2023-12-25 DIAGNOSIS — D72819 Decreased white blood cell count, unspecified: Secondary | ICD-10-CM

## 2023-12-25 DIAGNOSIS — Z0000 Encounter for general adult medical examination without abnormal findings: Secondary | ICD-10-CM | POA: Diagnosis not present

## 2023-12-25 DIAGNOSIS — R768 Other specified abnormal immunological findings in serum: Secondary | ICD-10-CM | POA: Diagnosis not present

## 2023-12-25 NOTE — Assessment & Plan Note (Signed)
Positive rheumatoid factor test. No current symptoms of joint pain or stiffness, possibly due to recent weight loss. Family history of arthritis, but unclear if rheumatoid arthritis. Discussed that rheumatoid factor is a screening test and additional testing is needed to confirm diagnosis. If rheumatoid factor remains positive, referral to rheumatology will be necessary.  We will check anti-CCP antibody on return visit along with repeat rheumatoid factor.

## 2023-12-25 NOTE — Assessment & Plan Note (Signed)
Up to date on mammograms (she is getting it every two years) and colonoscopy (last in April 2023 with benign polyp removal and diagnosis of diverticulosis).

## 2023-12-25 NOTE — Progress Notes (Signed)
[facility]  HEMATOLOGY-ONCOLOGY ELECTRONIC VISIT PROGRESS NOTE  PATIENT NAME: Tina Harris   MR#: [id number] DOB: [birth year]  DATE OF SERVICE: 12/25/2023  Patient Care Team: Early, Sung Amabile, NP as PCP - General (Nurse Practitioner)  I connected with the patient via telephone conference and verified that I am speaking with the correct person using two identifiers. The patient's location is at home and I am providing care from the [facility].  I discussed the limitations, risks, security and privacy concerns of performing an evaluation and management service by e-visits and the availability of in person appointments.  I also discussed with the patient that there may be a patient responsible charge related to this service. The patient expressed understanding and agreed to proceed.   ASSESSMENT & PLAN:   MARYA LOWDEN is a 68 y.o. 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 No immature or abnormal cells observed.   Differential includes vitamin B12 deficiency, iron deficiency, folic acid deficiency, autoimmune conditions, thyroid dysfunction, viral infections (EBV, HIV, hepatitis), and benign neutropenia. No red flags such as abnormal red cell or platelet counts, or signs of bone marrow pathology.    Recent viral infections (COVID-19, strep throat) could contribute.    Explained that viral infections can cause low WBC and vice versa.    Some individuals live with benign neutropenia without significant issues. Current findings do not indicate a serious underlying condition.   On her initial consultation with [place] on 12/18/23, labs showed slightly improved white count of 3000 with ANC of 1400, normal differential.  Hemoglobin normal at 13.3, MCV 3.4.  Platelet  count normal at 331,000.  CMP unremarkable.  Iron studies, B12, folate, methylmalonic acid, LDH were all within normal limits. ANA negative. Hepatitis panel, HIV were negative.  Flow cytometry of peripheral blood was unremarkable.  EBV IgG was positive at >600, indicating past infection.  Rheumatoid factor was positive at 273.8 international units/mL.   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  We will pursue additional workup to rule out underlying rheumatoid arthritis, which could also explain leukopenia.    - Schedule follow-up appointment in three months for repeat blood work  - Advise to report any unexplained fevers, chills, night sweats, or recurrent infections  Gastroesophageal reflux disease Chronic GERD exacerbated by late-night eating. Discontinued Nexium due to osteopenia concerns. Currently managing symptoms through dietary modifications. Discussed avoiding late-night eating and high-fat foods before bedtime. Consider re-evaluating pharmacological intervention if symptoms persist.  Healthcare maintenance Up to date on mammograms (she is getting it every two years) and colonoscopy (last in April 2023 with benign polyp removal and diagnosis of diverticulosis).  Rheumatoid factor positive Positive rheumatoid factor test. No current symptoms of joint pain or stiffness, possibly due to recent weight loss. Family history of arthritis, but unclear if rheumatoid arthritis. Discussed that rheumatoid factor is a screening test and additional testing is needed to confirm diagnosis. If rheumatoid factor remains positive, referral to rheumatology will be necessary.  We will check anti-CCP antibody on return visit along with repeat rheumatoid factor.   I discussed the assessment and treatment plan with the patient. The patient was provided an opportunity to ask questions and all were answered. The patient agreed with the plan and demonstrated an understanding of the  instructions.  The patient was advised to call back or seek an in-person evaluation if the symptoms worsen or if the condition fails to improve as anticipated.    I spent 12 minutes over the phone with the patient reviewing test results, discuss management and coordination/planning of care.  Meryl Crutch, MD 12/25/2023 4:59 PM [facility] - A DE[facility] OF [facility] [address] [place] [address] Dept: [phone] Dept Fax: [phone]   INTERVAL HISTORY:  Please see above for problem oriented charting.  The purpose of today's discussion is to explain recent lab results and to formulate plan of care.  The white blood cell count had increased slightly from 2600 to 3000, which is similar to the count from a year ago. The patient's neutrophils were at 1400 and no immature cells were found in the blood. 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 The patient also tested positive for rheumatoid factor, indicating a potential for rheumatoid arthritis. The patient's sister has rheumatoid arthritis, and her grandfather and father had arthritis, but it is unclear if it was rheumatoid. The patient has not noticed any new joint pains and has felt less achy since losing weight.  SUMMARY OF HEMATOLOGY HISTORY:  She mentions a significant weight loss of over thirty pounds and about thirty-two inches due to a metabolic reset program she started in July 2024. The program involves a diet change to organic, natural foods, and a mindset change around food. She also reports a significant improvement in her overall health and a decrease in joint pain since starting the program.    On 11/08/2023, routine labs at her PCPs office showed white count of 2600, ANC of 1100, ALC of 1200, normal differential.  Hemoglobin was normal at 12.8, hematocrit 40.4, MCV 86.  Platelet count  normal at 324,000.  Previously labs in December 2023 showed white count of 3000 with ANC of 1200, labs in December 2022 showed white count of 3400, ANC of 1400.  Given persistent leukopenia, referral was sent to [place] for further evaluation.   She reports recent infections including strep throat and COVID-19 in January 2025. The patient was not aware of low white count issue until recently, although it has been present since at least 2022. The patient's white blood cell count has decreased from 3400 in 2022 to 2600 in December 2024.    The patient has been following a strict diet and has lost a significant amount of weight. The patient also has GERD and has been trying to manage it naturally. The patient has been taking a variety of vitamin supplements since 2015 but stopped in August. The patient has not been having recurrent infections, fevers, chills, or night sweats. The patient's appetite is good and there are no unusual bleeding or bruising symptoms.  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 No immature or abnormal cells observed.    Differential includes vitamin B12 deficiency, iron deficiency, folic acid deficiency, autoimmune conditions, thyroid dysfunction, viral infections (EBV, HIV, hepatitis), and benign neutropenia. No red flags such as abnormal red cell or platelet counts, or signs of bone marrow pathology.    Recent viral infections (COVID-19, strep throat) could contribute.    Explained that viral infections can cause low WBC and vice versa.    Some individuals live with benign neutropenia without significant issues. Current findings do not indicate  a serious underlying condition.   On her initial consultation with [place] on 12/18/23, labs showed slightly improved white count of 3000 with ANC of 1400, normal differential.  Hemoglobin normal at 13.3, MCV 3.4.  Platelet count normal at 331,000.  CMP unremarkable.  Iron studies, B12, folate,  methylmalonic acid, LDH were all within normal limits. ANA negative. Hepatitis panel, HIV were negative.  Flow cytometry of peripheral blood was unremarkable.  EBV IgG was positive at >600, indicating past infection.  Rheumatoid factor was positive at 273.8 international units/mL.   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  We will pursue additional workup to rule out underlying rheumatoid arthritis, which could also explain leukopenia.    REVIEW OF SYSTEMS:    Review of Systems - Oncology  All other pertinent systems were reviewed with the patient and are negative.  I have reviewed the past medical history, past surgical history, social history and family history with the patient and they are unchanged from previous note.  ALLERGIES:  She has no known allergies.  MEDICATIONS:  Current Outpatient Medications  Medication Sig Dispense Refill   Multiple Vitamins-Minerals (EMERGEN-C IMMUNE PLUS) PACK Take 1 tablet by mouth 2 (two) times daily. 10 each 0   No current facility-administered medications for this visit.    PHYSICAL EXAMINATION:   Onc Performance Status - 12/25/23 1600       ECOG Perf Status   ECOG Perf Status Fully active, able to carry on all pre-disease performance without restriction      KPS SCALE   KPS % SCORE Normal, no compliants, no evidence of disease             LABORATORY DATA:   I have reviewed the data as listed.  Recent Results (from the past 2160 hours)  CBC with Differential/Platelet     Status: Abnormal   Collection Time: 11/08/23 12:40 PM  Result Value Ref Range   WBC 2.6 (L) 3.4 - 10.8 x10E3/uL   RBC 4.71 3.77 - 5.28 x10E6/uL   Hemoglobin 12.8 11.1 - 15.9 g/dL   Hematocrit 16.1 09.6 - 46.6 %   MCV 86 79 - 97 fL   MCH 27.2 26.6 - 33.0 pg   MCHC 31.7 31.5 - 35.7 g/dL   RDW 04.5 40.9 - 81.1 %   Platelets 325 150 - 450 x10E3/uL   Neutrophils 42 Not Estab. %   Lymphs 45 Not Estab. %   Monocytes 11 Not Estab. %   Eos 1  Not Estab. %   Basos 1 Not Estab. %   Neutrophils Absolute 1.1 (L) 1.4 - 7.0 x10E3/uL   Lymphocytes Absolute 1.2 0.7 - 3.1 x10E3/uL   Monocytes Absolute 0.3 0.1 - 0.9 x10E3/uL   EOS (ABSOLUTE) 0.0 0.0 - 0.4 x10E3/uL   Basophils Absolute 0.0 0.0 - 0.2 x10E3/uL   Immature Granulocytes 0 Not Estab. %   Immature Grans (Abs) 0.0 0.0 - 0.1 x10E3/uL  Comprehensive metabolic panel     Status: Abnormal   Collection Time: 11/08/23 12:40 PM  Result Value Ref Range   Glucose 89 70 - 99 mg/dL   BUN 18 8 - 27 mg/dL   Creatinine, Ser 9.14 0.57 - 1.00 mg/dL   eGFR 74 >78 GN/FAO/1.30   BUN/Creatinine Ratio 21 12 - 28   Sodium 145 (H) 134 - 144 mmol/L   Potassium 4.1 3.5 - 5.2 mmol/L   Chloride 105 96 - 106 mmol/L   CO2 25 20 - 29 mmol/L   Calcium  9.7 8.7 - 10.3 mg/dL   Total Protein 7.2 6.0 - 8.5 g/dL   Albumin 4.6 3.9 - 4.9 g/dL   Globulin, Total 2.6 1.5 - 4.5 g/dL   Bilirubin Total 0.3 0.0 - 1.2 mg/dL   Alkaline Phosphatase 89 44 - 121 IU/L   AST 25 0 - 40 IU/L   ALT 21 0 - 32 IU/L  Lipid panel     Status: Abnormal   Collection Time: 11/08/23 12:40 PM  Result Value Ref Range   Cholesterol, Total 247 (H) 100 - 199 mg/dL   Triglycerides 64 0 - 149 mg/dL   HDL 85 >65 mg/dL   VLDL Cholesterol Cal 10 5 - 40 mg/dL   LDL Chol Calc (NIH) 784 (H) 0 - 99 mg/dL   Chol/HDL Ratio 2.9 0.0 - 4.4 ratio    Comment:                                   T. Chol/HDL Ratio                                             Men  Women                               1/2 Avg.Risk  3.4    3.3                                   Avg.Risk  5.0    4.4                                2X Avg.Risk  9.6    7.1                                3X Avg.Risk 23.4   11.0   VITAMIN D 25 Hydroxy (Vit-D Deficiency, Fractures)     Status: None   Collection Time: 11/08/23 12:40 PM  Result Value Ref Range   Vit D, 25-Hydroxy 62.3 30.0 - 100.0 ng/mL    Comment: Vitamin D deficiency has been defined by the Institute of Medicine and an  Endocrine Society practice guideline as a level of serum 25-OH vitamin D less than 20 ng/mL (1,2). The Endocrine Society went on to further define vitamin D insufficiency as a level between 21 and 29 ng/mL (2). 1. IOM (Institute of Medicine). 2010. Dietary reference    intakes for calcium and D. [place]: The    [employer]. 2. Holick MF, Binkley Riggins, Bischoff-Ferrari HA, et al.    Evaluation, treatment, and prevention of vitamin D    deficiency: an Endocrine Society clinical practice    guideline. JCEM. 2011 Jul; 96(7):1911-30.   Vitamin B12     Status: None   Collection Time: 11/08/23 12:40 PM  Result Value Ref Range   Vitamin B-12 734 232 - 1,245 pg/mL  Hemoglobin A1c     Status: None   Collection Time: 11/08/23 12:40 PM  Result Value Ref Range   Hgb A1c MFr Bld 5.3 4.8 - 5.6 %  Comment:          Prediabetes: 5.7 - 6.4          Diabetes: >6.4          Glycemic control for adults with diabetes: <7.0    Est. average glucose Bld gHb Est-mCnc 105 mg/dL  Rapid Strep A     Status: Abnormal   Collection Time: 11/27/23 12:03 PM  Result Value Ref Range   Rapid Strep A Screen Positive (A) Negative  POC COVID-19     Status: Abnormal   Collection Time: 11/27/23 12:03 PM  Result Value Ref Range   SARS Coronavirus 2 Ag Positive (A) Negative  Influenza A/B     Status: Abnormal   Collection Time: 11/27/23 12:03 PM  Result Value Ref Range   Influenza A, POC Negative Negative   Influenza B, POC Negative Negative  Surgical pathology     Status: None   Collection Time: 12/18/23 12:00 AM  Result Value Ref Range   SURGICAL PATHOLOGY      Surgical Pathology CASE: [id number] PATIENT: Hali Enrique Flow Pathology Report     Clinical history: Leukopenia, unspecified type     DIAGNOSIS:  -No monoclonal B-cell population or abnormal T-cell phenotype identified   GATING AND PHENOTYPIC ANALYSIS:  Gated population: Flow cytometric immunophenotyping is  performed using antibodies to the antigens listed in the table below. Electronic gates are placed around a cell cluster displaying light scatter properties corresponding to: lymphocytes  Abnormal Cells in gated population: N/A  Phenotype of Abnormal Cells: N/A                      Lymphoid Antigens       Myeloid Antigens Miscellaneous CD2  tested    CD10 tested    CD11b     ND   CD45 tested CD3  tested    CD19 tested    CD11c     ND   HLA-Dr    ND CD4  tested    CD20 tested    CD13 ND   CD34 tested CD5  tested    CD22 ND   CD14 ND   CD38 tested CD7  tested    CD79b     ND   CD15 ND   CD138     ND CD8  tested    CD103     ND   CD16 ND   TdT   ND CD25 ND   CD200     tested    CD33 ND   CD123     ND TCRab     ND   sKappa    tested    CD64 ND   CD41 ND TCRgd     tested    sLambda   tested    CD117     ND   CD61 ND CD56 tested    cKappa    ND   MPO  ND   CD71 ND CD57 ND   cLambda   ND        [id number]       GROSS DESCRIPTION:  One lavender top tube submitted from [facility] for lymphoma testing.    Final Diagnosis performed by Guerry Bruin, MD.   Electronically signed 12/19/2023 Technical and / or Professional components performed at [facility], [address] [place], [place] [address].  The above tests were developed and their performance characteristics determined by the [facility] system for the physical and  immunophenotypic characterization of cell populations. They have not been cleared by the U.S. Food and Drug administration. The  FDA has determined that such clearance or approval is not necessary. This test is used for clinical purposes. It should not be  regarded as  investigational or for research   Flow Cytometry, Peripheral Blood (Oncology)     Status: None   Collection Time: 12/18/23  2:52 PM  Result Value Ref Range   Flow Cytometry SEE SEPARATE REPORT     Comment: Performed at [facility], [address],  [place], [place] [address]  Epstein-Barr virus VCA, IgG     Status: Abnormal   Collection Time: 12/18/23  2:52 PM  Result Value Ref Range   EBV VCA IgG >600.0 (H) 0.0 - 17.9 U/mL    Comment: (NOTE)                                 Negative        <18.0                                 Equivocal 18.0 - 21.9                                 Positive        >21.9 Performed At: [facility] [address] [place], [place] [id number] Jolene Schimke MD OX:[id number]   HIV antibody (with reflex)     Status: None   Collection Time: 12/18/23  2:52 PM  Result Value Ref Range   HIV Screen 4th Generation wRfx Non Reactive Non Reactive    Comment: Performed at [facility], [address] [place], [place] [address]  Hepatitis panel, acute     Status: None   Collection Time: 12/18/23  2:52 PM  Result Value Ref Range   Hepatitis B Surface Ag NON REACTIVE NON REACTIVE   HCV Ab NON REACTIVE NON REACTIVE    Comment: (NOTE) Nonreactive HCV antibody screen is consistent with no HCV infections,  unless recent infection is suspected or other evidence exists to indicate HCV infection.     Hep A IgM NON REACTIVE NON REACTIVE   Hep B C IgM NON REACTIVE NON REACTIVE    Comment: Performed at [facility], [address], [place], [place] [address]  Methylmalonic acid, serum     Status: None   Collection Time: 12/18/23  2:52 PM  Result Value Ref Range   Methylmalonic Acid, Quantitative 142 0 - 378 nmol/L    Comment: (NOTE) This test was developed and its performance characteristics determined by [facility]. It has not been cleared or approved by the Food and Drug Administration. Performed At: [facility] [address] [place], [place] [id number] Jolene Schimke MD [id number]   Rheumatoid factor     Status: Abnormal   Collection Time: 12/18/23  2:52 PM  Result Value Ref Range   Rheumatoid fact SerPl-aCnc 273.8 (H) <14.0 IU/mL    Comment: (NOTE) Results confirmed  on dilution. Performed At: [facility] [address] [place], [place] [id number] Jolene Schimke MD OV:[id number]   Lactate dehydrogenase     Status: None   Collection Time: 12/18/23  2:52 PM  Result Value Ref Range   LDH 162 98 - 192 U/L  Comment: Performed at [facility], [address] [place], [place] [address]  Iron and TIBC     Status: None   Collection Time: 12/18/23  2:52 PM  Result Value Ref Range   Iron 81 28 - 170 ug/dL   TIBC 604 540 - 981 ug/dL   Saturation Ratios 23 10.4 - 31.8 %   UIBC 266 ug/dL    Comment: Performed at [facility], [address], [place], [place] [address]  Comprehensive metabolic panel     Status: Abnormal   Collection Time: 12/18/23  2:52 PM  Result Value Ref Range   Sodium 139 135 - 145 mmol/L   Potassium 3.6 3.5 - 5.1 mmol/L   Chloride 102 98 - 111 mmol/L   CO2 31 22 - 32 mmol/L   Glucose, Bld 85 70 - 99 mg/dL    Comment: Glucose reference range applies only to samples taken after fasting for at least 8 hours.   BUN 23 8 - 23 mg/dL   Creatinine, Ser 8.29 0.44 - 1.00 mg/dL   Calcium 9.9 8.9 - 56.2 mg/dL   Total Protein 8.1 6.5 - 8.1 g/dL   Albumin 4.6 3.5 - 5.0 g/dL   AST 14 (L) 15 - 41 U/L   ALT 10 0 - 44 U/L   Alkaline Phosphatase 70 38 - 126 U/L   Total Bilirubin 0.5 0.0 - 1.2 mg/dL   GFR, Estimated >13 >08 mL/min    Comment: (NOTE) Calculated using the CKD-EPI Creatinine Equation (2021)    Anion gap 6 5 - 15    Comment: Performed at [facility], [address], [place], [place] [address]  CBC with Differential/Platelet     Status: Abnormal   Collection Time: 12/18/23  2:52 PM  Result Value Ref Range   WBC 3.0 (L) 4.0 - 10.5 K/uL   RBC 4.75 3.87 - 5.11 MIL/uL   Hemoglobin 13.3 12.0 - 15.0 g/dL   HCT 69.6 29.5 - 28.4 %   MCV 83.4 80.0 - 100.0 fL   MCH 28.0 26.0 - 34.0 pg   MCHC 33.6 30.0 - 36.0 g/dL   RDW 13.2 44.0 - 10.2 %   Platelets 331 150 - 400 K/uL   nRBC  0.0 0.0 - 0.2 %   Neutrophils Relative % 47 %   Neutro Abs 1.4 (L) 1.7 - 7.7 K/uL   Lymphocytes Relative 41 %   Lymphs Abs 1.2 0.7 - 4.0 K/uL   Monocytes Relative 10 %   Monocytes Absolute 0.3 0.1 - 1.0 K/uL   Eosinophils Relative 1 %   Eosinophils Absolute 0.0 0.0 - 0.5 K/uL   Basophils Relative 1 %   Basophils Absolute 0.0 0.0 - 0.1 K/uL   Immature Granulocytes 0 %   Abs Immature Granulocytes 0.00 0.00 - 0.07 K/uL    Comment: Performed at [facility], [address], [place], [place] [address]  ANA w/Reflex if Positive     Status: None   Collection Time: 12/18/23  2:53 PM  Result Value Ref Range   Anti Nuclear Antibody (ANA) Negative Negative    Comment: (NOTE) Performed At: [facility] [address] [place], [place] [id number] Jolene Schimke MD VZ:[id number]   Folate     Status: None   Collection Time: 12/18/23  2:53 PM  Result Value Ref Range   Folate 11.5 >5.9 ng/mL    Comment: Performed at [facility], [address] [place], [place] [address]  Ferritin  Status: None   Collection Time: 12/18/23  2:53 PM  Result Value Ref Range   Ferritin 103 11 - 307 ng/mL    Comment: Performed at [facility], [address] [place], [place] [address]     RADIOGRAPHIC STUDIES:  I have personally reviewed the radiological images as listed and agree with the findings in the report.  MM 3D SCREENING MAMMOGRAM BILATERAL BREAST Result Date: 12/21/2023 CLINICAL DATA:  Screening. EXAM: DIGITAL SCREENING BILATERAL MAMMOGRAM WITH TOMOSYNTHESIS AND CAD TECHNIQUE: Bilateral screening digital craniocaudal and mediolateral oblique mammograms were obtained. Bilateral screening digital breast tomosynthesis was performed. The images were evaluated with computer-aided detection. COMPARISON:  Previous exam(s). ACR Breast Density Category b: There are scattered areas of fibroglandular density. FINDINGS: There are no findings suspicious for  malignancy. IMPRESSION: No mammographic evidence of malignancy. A result letter of this screening mammogram will be mailed directly to the patient. RECOMMENDATION: Screening mammogram in one year. (Code:SM-B-01Y) BI-RADS CATEGORY  1: Negative. Electronically Signed   By: Meda Klinefelter M.D.   On: 12/21/2023 10:21    Orders Placed This Encounter  Procedures   Cyclic Citrul Peptide Ab, IgG, IgA    Standing Status:   Future    Expected Date:   03/18/2024    Expiration Date:   12/24/2024   Rheumatoid factor    Standing Status:   Future    Expected Date:   03/18/2024    Expiration Date:   12/24/2024   CBC with Differential (Cancer Center Only)    Standing Status:   Future    Expected Date:   03/18/2024    Expiration Date:   12/24/2024   CMP (Cancer Center only)    Standing Status:   Future    Expected Date:   03/18/2024    Expiration Date:   12/24/2024   Epstein-Barr virus VCA, IgM    Standing Status:   Future    Expected Date:   03/18/2024    Expiration Date:   12/24/2024     Future Appointments  Date Time Provider Department Center  03/18/2024 11:15 AM [facility] PHLEBOTOMIST [facility] None  03/18/2024 11:40 AM Carle Fenech, Archie Patten, MD [facility] None  04/29/2024  2:50 PM [facility]-ANNUAL WELLNESS VISIT [facility]  11/18/2024 10:45 AM Early, Sung Amabile, NP [facility]    This document was completed utilizing speech recognition software. Grammatical errors, random word insertions, pronoun errors, and incomplete sentences are an occasional consequence of this system due to software limitations, ambient noise, and hardware issues. Any formal questions or concerns about the content, text or information contained within the body of this dictation should be directly addressed to the provider for clarification.

## 2023-12-25 NOTE — Assessment & Plan Note (Signed)
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 No immature or abnormal cells observed.   Differential includes vitamin B12 deficiency, iron deficiency, folic acid deficiency, autoimmune conditions, thyroid dysfunction, viral infections (EBV, HIV, hepatitis), and benign neutropenia. No red flags such as abnormal red cell or platelet counts, or signs of bone marrow pathology.    Recent viral infections (COVID-19, strep throat) could contribute.    Explained that viral infections can cause low WBC and vice versa.    Some individuals live with benign neutropenia without significant issues. Current findings do not indicate a serious underlying condition.   On her initial consultation with [place] on 12/18/23, labs showed slightly improved white count of 3000 with ANC of 1400, normal differential.  Hemoglobin normal at 13.3, MCV 3.4.  Platelet count normal at 331,000.  CMP unremarkable.  Iron studies, B12, folate, methylmalonic acid, LDH were all within normal limits. ANA negative. Hepatitis panel, HIV were negative.  Flow cytometry of peripheral blood was unremarkable.  EBV IgG was positive at >600, indicating past infection.  Rheumatoid factor was positive at 273.8 international units/mL.   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  We will pursue additional workup to rule out underlying rheumatoid arthritis, which could also explain leukopenia.    - Schedule follow-up appointment in three months for repeat blood work  - Advise to report any unexplained fevers, chills, night sweats, or recurrent infections

## 2023-12-25 NOTE — Assessment & Plan Note (Signed)
Chronic GERD exacerbated by late-night eating. Discontinued Nexium due to osteopenia concerns. Currently managing symptoms through dietary modifications. Discussed avoiding late-night eating and high-fat foods before bedtime. Consider re-evaluating pharmacological intervention if symptoms persist.

## 2024-01-01 ENCOUNTER — Telehealth: Payer: Medicare PPO | Admitting: Oncology

## 2024-03-18 ENCOUNTER — Inpatient Hospital Stay (HOSPITAL_BASED_OUTPATIENT_CLINIC_OR_DEPARTMENT_OTHER): Payer: Medicare PPO | Admitting: Oncology

## 2024-03-18 ENCOUNTER — Encounter: Payer: Self-pay | Admitting: Oncology

## 2024-03-18 ENCOUNTER — Inpatient Hospital Stay: Payer: Medicare PPO | Attending: Oncology

## 2024-03-18 VITALS — BP 117/65 | HR 70 | Temp 98.0°F | Resp 17 | Ht 65.0 in | Wt 156.9 lb

## 2024-03-18 DIAGNOSIS — R760 Raised antibody titer: Secondary | ICD-10-CM | POA: Insufficient documentation

## 2024-03-18 DIAGNOSIS — D72819 Decreased white blood cell count, unspecified: Secondary | ICD-10-CM

## 2024-03-18 DIAGNOSIS — Z0000 Encounter for general adult medical examination without abnormal findings: Secondary | ICD-10-CM | POA: Diagnosis not present

## 2024-03-18 DIAGNOSIS — R768 Other specified abnormal immunological findings in serum: Secondary | ICD-10-CM

## 2024-03-18 DIAGNOSIS — Z8261 Family history of arthritis: Secondary | ICD-10-CM | POA: Diagnosis not present

## 2024-03-18 DIAGNOSIS — Z8616 Personal history of COVID-19: Secondary | ICD-10-CM | POA: Diagnosis not present

## 2024-03-18 LAB — CBC WITH DIFFERENTIAL (CANCER CENTER ONLY)
Abs Immature Granulocytes: 0.01 10*3/uL (ref 0.00–0.07)
Basophils Absolute: 0 10*3/uL (ref 0.0–0.1)
Basophils Relative: 1 %
Eosinophils Absolute: 0.1 10*3/uL (ref 0.0–0.5)
Eosinophils Relative: 2 %
HCT: 38.5 % (ref 36.0–46.0)
Hemoglobin: 12.9 g/dL (ref 12.0–15.0)
Immature Granulocytes: 0 %
Lymphocytes Relative: 49 %
Lymphs Abs: 1.5 10*3/uL (ref 0.7–4.0)
MCH: 27.7 pg (ref 26.0–34.0)
MCHC: 33.5 g/dL (ref 30.0–36.0)
MCV: 82.6 fL (ref 80.0–100.0)
Monocytes Absolute: 0.2 10*3/uL (ref 0.1–1.0)
Monocytes Relative: 8 %
Neutro Abs: 1.2 10*3/uL — ABNORMAL LOW (ref 1.7–7.7)
Neutrophils Relative %: 40 %
Platelet Count: 303 10*3/uL (ref 150–400)
RBC: 4.66 MIL/uL (ref 3.87–5.11)
RDW: 13 % (ref 11.5–15.5)
WBC Count: 2.9 10*3/uL — ABNORMAL LOW (ref 4.0–10.5)
nRBC: 0 % (ref 0.0–0.2)

## 2024-03-18 LAB — CMP (CANCER CENTER ONLY)
ALT: 19 U/L (ref 0–44)
AST: 24 U/L (ref 15–41)
Albumin: 4.8 g/dL (ref 3.5–5.0)
Alkaline Phosphatase: 90 U/L (ref 38–126)
Anion gap: 11 (ref 5–15)
BUN: 21 mg/dL (ref 8–23)
CO2: 29 mmol/L (ref 22–32)
Calcium: 10.3 mg/dL (ref 8.9–10.3)
Chloride: 103 mmol/L (ref 98–111)
Creatinine: 0.99 mg/dL (ref 0.44–1.00)
GFR, Estimated: >60 mL/min (ref >60)
Glucose, Bld: 93 mg/dL (ref 70–99)
Potassium: 4.5 mmol/L (ref 3.5–5.1)
Sodium: 142 mmol/L (ref 135–145)
Total Bilirubin: 0.4 mg/dL (ref 0.0–1.2)
Total Protein: 7.5 g/dL (ref 6.5–8.1)

## 2024-03-18 NOTE — Progress Notes (Signed)
 [facility]  HEMATOLOGY CLINIC PROGRESS NOTE  PATIENT NAME: [facility]   MR#: [id number] DOB: October 08, 1956  Patient Care Team: Annella Kief, NP as PCP - General (Nurse Practitioner)  Date of visit: 03/18/2024   ASSESSMENT & PLAN:   Tina [facility] is a 68 y.o. 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 No immature or abnormal cells observed.   Differential includes vitamin B12 deficiency, iron deficiency, folic acid  deficiency, autoimmune conditions, thyroid  dysfunction, viral infections (EBV, HIV, hepatitis), and benign neutropenia. No red flags such as abnormal red cell or platelet counts, or signs of bone marrow pathology.     Explained that viral infections can cause low WBC and vice versa.    Some individuals live with benign neutropenia without significant issues. Current findings do not indicate a serious underlying condition.   On her initial consultation with us  on 12/18/23, labs showed slightly improved white count of 3000 with ANC of 1400, normal differential.  Hemoglobin normal at 13.3, MCV 3.4.  Platelet count normal at 331,000.  CMP unremarkable.  Iron studies, B12, folate, methylmalonic acid, LDH were all within normal limits. ANA negative. Hepatitis panel, HIV were negative.  Flow cytometry of peripheral blood was unremarkable.  EBV IgG was positive at >600, indicating past infection.  Rheumatoid factor was positive at 273.8 international units/mL.   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  We will pursue additional workup to rule out underlying rheumatoid arthritis, which could also explain leukopenia.  Anti-CCP antibodies are pending from today.  Labs today reveal stable white count of 2900 with ANC of 1200.   Hemoglobin 12.9, platelet count normal at 303,000.  CMP unremarkable.  Patient was provided reassurance.  She remains asymptomatic from this leukopenia.   - Schedule follow-up appointment in 4 months for repeat blood work  - Advise to report any unexplained fevers, chills, night sweats, or recurrent infections  Rheumatoid factor positive Positive rheumatoid factor test. No current symptoms of joint pain or stiffness, possibly due to recent weight loss. Family history of arthritis, but unclear if rheumatoid arthritis. Discussed that rheumatoid factor is a screening test and additional testing is needed to confirm diagnosis. If rheumatoid factor remains positive, referral to rheumatology will be necessary.  Anti-CCP antibody was checked today.  Will follow-up on result.  Healthcare maintenance Up to date on mammograms (she is getting it every two years) and colonoscopy (last in April 2023 with benign polyp removal and diagnosis of diverticulosis).    I spent a total of 25 minutes during this encounter with the patient including review of chart and various tests results, discussions about plan of care and coordination of care plan.  I reviewed lab results and outside records for this visit and discussed relevant results with the patient. Diagnosis, plan of care and treatment options were also discussed in detail with the patient. Opportunity provided to ask questions and answers provided to her apparent satisfaction. Provided instructions to call our clinic with any problems, questions or concerns prior to return visit. I recommended to continue follow-up with PCP and sub-specialists. She verbalized understanding and agreed with the plan. No barriers to learning was detected.  Arlo Berber, MD  03/18/2024 4:10 PM  [facility]  [facility] [place] - A DEPT OF 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   INTERVAL HISTORY:  Discussed the use of AI scribe software for clinical note transcription with the patient, who gave verbal consent to proceed.  History of Present Illness [facility] is a 68 year old female with a history of low white blood cell count who presents for follow-up of her hematological condition.  She has experienced a low white blood cell count for the past three years, with levels ranging from 2,600 to 3,400. Her current count is 2,900. She has not had recent infections, fevers, chills, or night sweats. In January, she had infections including COVID-19, flu, and strep throat, which temporarily increased her white blood cell count. Her neutrophil count is currently 1,200 and has remained above 1,000 over the past three years.  There is a family history of arthritis, though not rheumatoid arthritis. Her rheumatoid factor was previously high, and a confirmatory test was conducted today. She has no joint pain or symptoms suggestive of rheumatoid arthritis.  She underwent a mammogram in February 2025 and a colonoscopy in April 2023. She maintains her weight and eats well, though she has recently increased her intake of sweets after a period of reduced carbohydrate consumption.  She has a history of osteoporosis and has increased her calcium  intake as a management strategy. She is not currently on any specific osteoporosis treatment.    SUMMARY OF HEMATOLOGIC HISTORY:  She mentions a significant weight loss of over thirty pounds and about thirty-two inches due to a metabolic reset program she started in July 2024. The program involves a diet change to organic, natural foods, and a mindset change around food. She also reports a significant improvement in her overall health and a decrease in joint pain since starting the program.    On 11/08/2023, routine labs at her PCPs office  showed white count of 2600, ANC of 1100, ALC of 1200, normal differential.  Hemoglobin was normal at 12.8, hematocrit 40.4, MCV 86.  Platelet count normal at 324,000.  Previously labs in December 2023 showed white count of 3000 with ANC of 1200, labs in December 2022 showed white count of 3400, ANC of 1400.  Given persistent leukopenia, referral was sent to us  for further evaluation.   She reports recent infections including strep throat and COVID-19 in January 2025. The patient was not aware of low white count issue until recently, although it has been present since at least 2022. The patient's white blood cell count has decreased from 3400 in 2022 to 2600 in December 2024.    The patient has been following a strict diet and has lost a significant amount of weight. The patient also has GERD and has been trying to manage it naturally. The patient has been taking a variety of vitamin supplements since 2015 but stopped in August. The patient has not been having recurrent infections, fevers, chills, or night sweats. The patient's appetite is good and there are no unusual bleeding or bruising symptoms.   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 No immature or abnormal cells observed.    Differential includes vitamin B12 deficiency, iron deficiency, folic acid  deficiency, autoimmune conditions, thyroid  dysfunction, viral infections (EBV, HIV, hepatitis), and benign neutropenia. No red flags such as abnormal red cell or platelet counts, or signs  of bone marrow pathology.    Recent viral infections (COVID-19, strep throat) could contribute.    Explained that viral infections can cause low WBC and vice versa.    Some individuals live with benign neutropenia without significant issues. Current findings do not indicate a serious underlying condition.   On her initial consultation with us  on 12/18/23, labs showed slightly improved white count of 3000 with ANC of 1400,  normal differential.  Hemoglobin normal at 13.3, MCV 3.4.  Platelet count normal at 331,000.  CMP unremarkable.  Iron studies, B12, folate, methylmalonic acid, LDH were all within normal limits. ANA negative. Hepatitis panel, HIV were negative.  Flow cytometry of peripheral blood was unremarkable.  EBV IgG was positive at >600, indicating past infection.  Rheumatoid factor was positive at 273.8 international units/mL.   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  We will pursue additional workup to rule out underlying rheumatoid arthritis, which could also explain leukopenia.  I have reviewed the past medical history, past surgical history, social history and family history with the patient and they are unchanged from previous note.  ALLERGIES: She has no known allergies.  MEDICATIONS:  Current Outpatient Medications  Medication Sig Dispense Refill   Multiple Vitamins-Minerals (EMERGEN-C IMMUNE PLUS) PACK Take 1 tablet by mouth 2 (two) times daily. 10 each 0   No current facility-administered medications for this visit.     REVIEW OF SYSTEMS:    Review of Systems - Oncology  All other pertinent systems were reviewed with the patient and are negative.  PHYSICAL EXAMINATION:   Onc Performance Status - 03/18/24 1130       ECOG Perf Status   ECOG Perf Status Fully active, able to carry on all pre-disease performance without restriction      KPS SCALE   KPS % SCORE Normal, no compliants, no evidence of disease             Vitals:   03/18/24 1100  BP: 117/65  Pulse: 70  Resp: 17  Temp: 98 F (36.7 C)  SpO2: 100%   Filed Weights   03/18/24 1100  Weight: 156 lb 14.4 oz (71.2 kg)    Physical Exam Constitutional:      General: She is not in acute distress.    Appearance: Normal appearance.  HENT:     Head: Normocephalic and atraumatic.  Eyes:     General: No scleral icterus.    Conjunctiva/sclera: Conjunctivae normal.  Cardiovascular:     Rate and  Rhythm: Normal rate and regular rhythm.     Heart sounds: Normal heart sounds.  Pulmonary:     Effort: Pulmonary effort is normal.     Breath sounds: Normal breath sounds.  Abdominal:     General: There is no distension.  Musculoskeletal:     Right lower leg: No edema.     Left lower leg: No edema.  Neurological:     General: No focal deficit present.     Mental Status: She is alert and oriented to person, place, and time.  Psychiatric:        Mood and Affect: Mood normal.        Behavior: Behavior normal.        Thought Content: Thought content normal.      LABORATORY DATA:   I have reviewed the data as listed.  Results for orders placed or performed in visit on 03/18/24  CMP (Cancer Center only)  Result Value Ref Range   Sodium  142 135 - 145 mmol/L   Potassium 4.5 3.5 - 5.1 mmol/L   Chloride 103 98 - 111 mmol/L   CO2 29 22 - 32 mmol/L   Glucose, Bld 93 70 - 99 mg/dL   BUN 21 8 - 23 mg/dL   Creatinine 1.61 0.96 - 1.00 mg/dL   Calcium  10.3 8.9 - 10.3 mg/dL   Total Protein 7.5 6.5 - 8.1 g/dL   Albumin 4.8 3.5 - 5.0 g/dL   AST 24 15 - 41 U/L   ALT 19 0 - 44 U/L   Alkaline Phosphatase 90 38 - 126 U/L   Total Bilirubin 0.4 0.0 - 1.2 mg/dL   GFR, Estimated >04 >54 mL/min   Anion gap 11 5 - 15  CBC with Differential (Cancer Center Only)  Result Value Ref Range   WBC Count 2.9 (L) 4.0 - 10.5 K/uL   RBC 4.66 3.87 - 5.11 MIL/uL   Hemoglobin 12.9 12.0 - 15.0 g/dL   HCT 09.8 11.9 - 14.7 %   MCV 82.6 80.0 - 100.0 fL   MCH 27.7 26.0 - 34.0 pg   MCHC 33.5 30.0 - 36.0 g/dL   RDW 82.9 56.2 - 13.0 %   Platelet Count 303 150 - 400 K/uL   nRBC 0.0 0.0 - 0.2 %   Neutrophils Relative % 40 %   Neutro Abs 1.2 (L) 1.7 - 7.7 K/uL   Lymphocytes Relative 49 %   Lymphs Abs 1.5 0.7 - 4.0 K/uL   Monocytes Relative 8 %   Monocytes Absolute 0.2 0.1 - 1.0 K/uL   Eosinophils Relative 2 %   Eosinophils Absolute 0.1 0.0 - 0.5 K/uL   Basophils Relative 1 %   Basophils Absolute 0.0 0.0 -  0.1 K/uL   Immature Granulocytes 0 %   Abs Immature Granulocytes 0.01 0.00 - 0.07 K/uL    RADIOGRAPHIC STUDIES:  No recent pertinent imaging studies available to review.  No orders of the defined types were placed in this encounter.    Future Appointments  Date Time Provider Department Center  04/29/2024  2:50 PM [facility]-ANNUAL WELLNESS VISIT [facility]  11/18/2024 10:45 AM Early, Adriane Albe, NP [facility]     This document was completed utilizing speech recognition software. Grammatical errors, random word insertions, pronoun errors, and incomplete sentences are an occasional consequence of this system due to software limitations, ambient noise, and hardware issues. Any formal questions or concerns about the content, text or information contained within the body of this dictation should be directly addressed to the provider for clarification.

## 2024-03-18 NOTE — Assessment & Plan Note (Addendum)
 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 No immature or abnormal cells observed.   Differential includes vitamin B12 deficiency, iron deficiency, folic acid  deficiency, autoimmune conditions, thyroid  dysfunction, viral infections (EBV, HIV, hepatitis), and benign neutropenia. No red flags such as abnormal red cell or platelet counts, or signs of bone marrow pathology.     Explained that viral infections can cause low WBC and vice versa.    Some individuals live with benign neutropenia without significant issues. Current findings do not indicate a serious underlying condition.   On her initial consultation with us  on 12/18/23, labs showed slightly improved white count of 3000 with ANC of 1400, normal differential.  Hemoglobin normal at 13.3, MCV 3.4.  Platelet count normal at 331,000.  CMP unremarkable.  Iron studies, B12, folate, methylmalonic acid, LDH were all within normal limits. ANA negative. Hepatitis panel, HIV were negative.  Flow cytometry of peripheral blood was unremarkable.  EBV IgG was positive at >600, indicating past infection.  Rheumatoid factor was positive at 273.8 international units/mL.   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  We will pursue additional workup to rule out underlying rheumatoid arthritis, which could also explain leukopenia.  Anti-CCP antibodies are pending from today.  Labs today reveal stable white count of 2900 with ANC of 1200.  Hemoglobin 12.9, platelet count normal at 303,000.  CMP unremarkable.  Patient was provided reassurance.  She remains asymptomatic from this leukopenia.   - Schedule follow-up appointment in 4 months for repeat blood work  - Advise to report any unexplained fevers, chills, night sweats, or recurrent infections

## 2024-03-18 NOTE — Assessment & Plan Note (Addendum)
 Positive rheumatoid factor test. No current symptoms of joint pain or stiffness, possibly due to recent weight loss. Family history of arthritis, but unclear if rheumatoid arthritis. Discussed that rheumatoid factor is a screening test and additional testing is needed to confirm diagnosis. If rheumatoid factor remains positive, referral to rheumatology will be necessary.  Anti-CCP antibody was checked today.  Will follow-up on result.

## 2024-03-18 NOTE — Assessment & Plan Note (Signed)
 Up to date on mammograms (she is getting it every two years) and colonoscopy (last in April 2023 with benign polyp removal and diagnosis of diverticulosis).

## 2024-03-19 LAB — RHEUMATOID FACTOR: Rheumatoid fact SerPl-aCnc: 243.2 [IU]/mL — ABNORMAL HIGH (ref <14.0)

## 2024-03-19 LAB — EPSTEIN-BARR VIRUS VCA, IGM: EBV VCA IgM: <36 U/mL (ref 0.0–35.9)

## 2024-03-19 LAB — CYCLIC CITRUL PEPTIDE ANTIBODY, IGG/IGA: CCP Antibodies IgG/IgA: 14 U (ref 0–19)

## 2024-04-29 ENCOUNTER — Ambulatory Visit: Payer: Medicare PPO

## 2024-04-29 DIAGNOSIS — Z0000 Encounter for general adult medical examination without abnormal findings: Secondary | ICD-10-CM | POA: Diagnosis not present

## 2024-04-29 DIAGNOSIS — E2839 Other primary ovarian failure: Secondary | ICD-10-CM

## 2024-04-29 NOTE — Progress Notes (Signed)
 Subjective:   Tina Harris is a 68 y.o. who presents for a Medicare Wellness preventive visit.  As a reminder, Annual Wellness Visits don't include a physical exam, and some assessments may be limited, especially if this visit is performed virtually. We may recommend an in-person follow-up visit with your provider if needed.  Visit Complete: Virtual I connected with  Tina Harris on 04/29/24 by a audio enabled telemedicine application and verified that I am speaking with the correct person using two identifiers.  Patient Location: Home  Provider Location: Office/Clinic  I discussed the limitations of evaluation and management by telemedicine. The patient expressed understanding and agreed to proceed.  Vital Signs: Because this visit was a virtual/telehealth visit, some criteria may be missing or patient reported. Any vitals not documented were not able to be obtained and vitals that have been documented are patient reported.  VideoError- Librarian, academic were attempted between this provider and patient, however failed, due to patient having technical difficulties OR patient did not have access to video capability.  We continued and completed visit with audio only.   Persons Participating in Visit: Patient.  AWV Questionnaire: Yes: Patient Medicare AWV questionnaire was completed by the patient on 04/25/2024; I have confirmed that all information answered by patient is correct and no changes since this date.  Cardiac Risk Factors include: advanced age (>4men, >67 women)     Objective:    Today's Vitals   04/29/24 1446  PainSc: 6    There is no height or weight on file to calculate BMI.     04/29/2024    2:51 PM 03/18/2024   11:24 AM 12/18/2023    2:14 PM 04/24/2023    2:37 PM 01/18/2022    2:26 PM  Advanced Directives  Does Patient Have a Medical Advance Directive? No No No No No  Would patient like information on creating a medical advance  directive? No - Patient declined No - Patient declined No - Patient declined  Yes (MAU/Ambulatory/Procedural Areas - Information given)    Current Medications (verified) Outpatient Encounter Medications as of 04/29/2024  Medication Sig   Multiple Vitamins-Minerals (EMERGEN-C IMMUNE PLUS) PACK Take 1 tablet by mouth 2 (two) times daily.   No facility-administered encounter medications on file as of 04/29/2024.    Allergies (verified) Patient has no known allergies.   History: Past Medical History:  Diagnosis Date   GERD (gastroesophageal reflux disease)    History of colon polyps    Past Surgical History:  Procedure Laterality Date   COLONOSCOPY     2014-2015   UPPER GASTROINTESTINAL ENDOSCOPY     Family History  Problem Relation Age of Onset   Prostate cancer Father    Stomach cancer Neg Hx    Colon cancer Neg Hx    Rectal cancer Neg Hx    Throat cancer Neg Hx    Social History   Socioeconomic History   Marital status: Widowed    Spouse name: Not on file   Number of children: Not on file   Years of education: Not on file   Highest education level: Bachelor's degree (e.g., BA, AB, BS)  Occupational History   Not on file  Tobacco Use   Smoking status: Never   Smokeless tobacco: Never  Vaping Use   Vaping status: Never Used  Substance and Sexual Activity   Alcohol use: Not Currently    Comment: rarely   Drug use: Never   Sexual activity: Not  Currently  Other Topics Concern   Not on file  Social History Narrative   Not on file   Social Drivers of Health   Financial Resource Strain: Low Risk  (04/25/2024)   Overall Financial Resource Strain (CARDIA)    Difficulty of Paying Living Expenses: Not hard at all  Food Insecurity: No Food Insecurity (04/25/2024)   Hunger Vital Sign    Worried About Running Out of Food in the Last Year: Never true    Ran Out of Food in the Last Year: Never true  Transportation Needs: No Transportation Needs (04/25/2024)   PRAPARE -  Administrator, Civil Service (Medical): No    Lack of Transportation (Non-Medical): No  Physical Activity: Sufficiently Active (04/25/2024)   Exercise Vital Sign    Days of Exercise per Week: 5 days    Minutes of Exercise per Session: 30 min  Stress: Stress Concern Present (04/25/2024)   [employer] of Occupational Health - Occupational Stress Questionnaire    Feeling of Stress: To some extent  Social Connections: Socially Isolated (04/25/2024)   Social Connection and Isolation Panel    Frequency of Communication with Friends and Family: More than three times a week    Frequency of Social Gatherings with Friends and Family: Twice a week    Attends Religious Services: Never    Database administrator or Organizations: No    Attends Engineer, structural: Not on file    Marital Status: Widowed    Tobacco Counseling Counseling given: Not Answered    Clinical Intake:  Pre-visit preparation completed: Yes  Pain : 0-10 Pain Score: 6  Pain Type: Acute pain Pain Location: Mouth Pain Descriptors / Indicators: Aching Pain Onset: 1 to 4 weeks ago Pain Frequency: Constant     Nutritional Risks: None Diabetes: No  Lab Results  Component Value Date   HGBA1C 5.3 11/08/2023   HGBA1C 5.5 11/07/2022   HGBA1C 5.4 11/03/2021     How often do you need to have someone help you when you read instructions, pamphlets, or other written materials from your doctor or pharmacy?: 1 - Never     Information entered by :: NAllen LPN   Activities of Daily Living     04/25/2024   11:20 AM  In your present state of health, do you have any difficulty performing the following activities:  Hearing? 0  Vision? 0  Difficulty concentrating or making decisions? 0  Walking or climbing stairs? 0  Dressing or bathing? 0  Doing errands, shopping? 0  Preparing Food and eating ? N  Using the Toilet? N  In the past six months, have you accidently leaked urine? N  Do you have  problems with loss of bowel control? N  Managing your Medications? N  Managing your Finances? N  Housekeeping or managing your Housekeeping? N    Patient Care Team: Early, Sara E, NP as PCP - General (Nurse Practitioner)  I have updated your Care Teams any recent Medical Services you may have received from other providers in the past year.     Assessment:   This is a routine wellness examination for Tina Harris.  Hearing/Vision screen Hearing Screening - Comments:: Denies hearing issues Vision Screening - Comments:: Regular eye exams, [facility]   Goals Addressed             This Visit's Progress    Patient Stated       04/29/2024, keep weight at goal  Depression Screen     04/29/2024    2:52 PM 12/18/2023    2:44 PM 04/24/2023    2:39 PM 11/07/2022   11:28 AM 01/18/2022    2:26 PM 11/03/2021    6:47 PM  PHQ 2/9 Scores  PHQ - 2 Score 0 0 0 0 0 0  PHQ- 9 Score 0  1       Fall Risk     04/25/2024   11:20 AM 11/08/2023   11:04 AM 04/20/2023    1:40 PM 11/07/2022   11:27 AM 01/18/2022    2:26 PM  Fall Risk   Falls in the past year? 0  0 0 0  Number falls in past yr: 0 0 0 0   Injury with Fall? 0 0 0 0   Risk for fall due to : No Fall Risks No Fall Risks No Fall Risks No Fall Risks   Follow up Falls prevention discussed;Falls evaluation completed  Falls prevention discussed;Education provided;Falls evaluation completed Falls evaluation completed       Data saved with a previous flowsheet row definition    MEDICARE RISK AT HOME:  Medicare Risk at Home Any stairs in or around the home?: (Patient-Rptd) No Home free of loose throw rugs in walkways, pet beds, electrical cords, etc?: (Patient-Rptd) Yes Adequate lighting in your home to reduce risk of falls?: (Patient-Rptd) Yes Life alert?: (Patient-Rptd) No Use of a cane, walker or w/c?: (Patient-Rptd) No Grab bars in the bathroom?: (Patient-Rptd) No Shower chair or bench in shower?: (Patient-Rptd) No Elevated toilet  seat or a handicapped toilet?: (Patient-Rptd) Yes  TIMED UP AND GO:  Was the test performed?  No  Cognitive Function: 6CIT completed        04/29/2024    2:53 PM 04/24/2023    2:42 PM  6CIT Screen  What Year? 0 points 0 points  What month? 0 points 0 points  What time? 0 points 0 points  Count back from 20 0 points 0 points  Months in reverse 0 points 0 points  Repeat phrase 2 points 0 points  Total Score 2 points 0 points    Immunizations Immunization History  Administered Date(s) Administered   Td 11/03/2021    Screening Tests Health Maintenance  Topic Date Due   Pneumococcal Vaccine: 50+ Years (1 of 1 - PCV) Never done   Zoster Vaccines- Shingrix (1 of 2) Never done   COVID-19 Vaccine (1 - 2024-25 season) Never done   DEXA SCAN  01/11/2024   INFLUENZA VACCINE  06/13/2024   Medicare Annual Wellness (AWV)  04/29/2025   MAMMOGRAM  12/17/2025   DTaP/Tdap/Td (2 - Tdap) 11/04/2031   Colonoscopy  02/23/2032   HPV VACCINES  Aged Out   Meningococcal B Vaccine  Aged Out   Hepatitis C Screening  Discontinued    Health Maintenance  Health Maintenance Due  Topic Date Due   Pneumococcal Vaccine: 50+ Years (1 of 1 - PCV) Never done   Zoster Vaccines- Shingrix (1 of 2) Never done   COVID-19 Vaccine (1 - 2024-25 season) Never done   DEXA SCAN  01/11/2024   Health Maintenance Items Addressed: DEXA ordered, Due for pneumonia, shingles and covid vaccines.  Additional Screening:  Vision Screening: Recommended annual ophthalmology exams for early detection of glaucoma and other disorders of the eye. Would you like a referral to an eye doctor? No    Dental Screening: Recommended annual dental exams for proper oral hygiene  Community Resource Referral / Chronic  Care Management: CRR required this visit?  No   CCM required this visit?  No   Plan:    I have personally reviewed and noted the following in the patient's chart:   Medical and social history Use of  alcohol, tobacco or illicit drugs  Current medications and supplements including opioid prescriptions. Patient is not currently taking opioid prescriptions. Functional ability and status Nutritional status Physical activity Advanced directives List of other physicians Hospitalizations, surgeries, and ER visits in previous 12 months Vitals Screenings to include cognitive, depression, and falls Referrals and appointments  In addition, I have reviewed and discussed with patient certain preventive protocols, quality metrics, and best practice recommendations. A written personalized care plan for preventive services as well as general preventive health recommendations were provided to patient.   Areatha Beecham, LPN   1/61/0960   After Visit Summary: (MyChart) Due to this being a telephonic visit, the after visit summary with patients personalized plan was offered to patient via MyChart   Notes: Nothing significant to report at this time.

## 2024-04-29 NOTE — Patient Instructions (Addendum)
 Tina Harris , Thank you for taking time out of your busy schedule to complete your Annual Wellness Visit with me. I enjoyed our conversation and look forward to speaking with you again next year. I, as well as your care team,  appreciate your ongoing commitment to your health goals. Please review the following plan we discussed and let me know if I can assist you in the future. Your Game plan/ To Do List    Referrals: If you haven't heard from the office you've been referred to, please reach out to them at the phone provided.  You have an order for:  []   2D Mammogram  []   3D Mammogram  [x]   Bone Density     Please call for appointment:  [facility] [address] [place], [place] [address] [phone]   Make sure to wear two-piece clothing.  No lotions, powders, or deodorants the day of the appointment. Make sure to bring picture ID and insurance card.  Bring list of medications you are currently taking including any supplements.   Follow up Visits: Next Medicare AWV with our clinical staff: 05/05/2025 at 2:50   Have you seen your provider in the last 6 months (3 months if uncontrolled diabetes)? Yes Next Office Visit with your provider: 11/18/2024 at 10:45  Clinician Recommendations:  Aim for 30 minutes of exercise or brisk walking, 6-8 glasses of water, and 5 servings of fruits and vegetables each day.       This is a list of the screening recommended for you and due dates:  Health Maintenance  Topic Date Due   Pneumococcal Vaccine for age over 40 (1 of 1 - PCV) Never done   Zoster (Shingles) Vaccine (1 of 2) Never done   COVID-19 Vaccine (1 - 2024-25 season) Never done   DEXA scan (bone density measurement)  01/11/2024   Flu Shot  06/13/2024   Medicare Annual Wellness Visit  04/29/2025   Mammogram  12/17/2025   DTaP/Tdap/Td vaccine (2 - Tdap) 11/04/2031   Colon Cancer Screening  02/23/2032   HPV Vaccine  Aged Out   Meningitis B Vaccine  Aged Out    Hepatitis C Screening  Discontinued    Advanced directives: (ACP Link)Information on Advanced Care Planning can be found at [place]  Secretary of [facility] Advance Health Care Directives Advance Health Care Directives. [email or web address]  Advance Care Planning is important because it:  [x]  Makes sure you receive the medical care that is consistent with your values, goals, and preferences  [x]  It provides guidance to your family and loved ones and reduces their decisional burden about whether or not they are making the right decisions based on your wishes.  Follow the link provided in your after visit summary or read over the paperwork we have mailed to you to help you started getting your Advance Directives in place. If you need assistance in completing these, please reach out to us  so that we can help you!  See attachments for Preventive Care and Fall Prevention Tips.

## 2024-06-11 DIAGNOSIS — L03116 Cellulitis of left lower limb: Secondary | ICD-10-CM | POA: Diagnosis not present

## 2024-06-18 ENCOUNTER — Encounter: Payer: Self-pay | Admitting: Nurse Practitioner

## 2024-07-21 ENCOUNTER — Encounter: Payer: Self-pay | Admitting: Oncology

## 2024-07-21 ENCOUNTER — Inpatient Hospital Stay: Attending: Oncology

## 2024-07-21 ENCOUNTER — Other Ambulatory Visit: Payer: Self-pay | Admitting: Oncology

## 2024-07-21 ENCOUNTER — Inpatient Hospital Stay: Admitting: Oncology

## 2024-07-21 VITALS — BP 123/62 | HR 58 | Temp 97.3°F | Resp 17 | Ht 65.0 in | Wt 159.5 lb

## 2024-07-21 DIAGNOSIS — D72819 Decreased white blood cell count, unspecified: Secondary | ICD-10-CM | POA: Diagnosis not present

## 2024-07-21 DIAGNOSIS — Z0000 Encounter for general adult medical examination without abnormal findings: Secondary | ICD-10-CM

## 2024-07-21 DIAGNOSIS — R768 Other specified abnormal immunological findings in serum: Secondary | ICD-10-CM

## 2024-07-21 LAB — CBC WITH DIFFERENTIAL (CANCER CENTER ONLY)
Abs Immature Granulocytes: 0.01 K/uL (ref 0.00–0.07)
Basophils Absolute: 0 K/uL (ref 0.0–0.1)
Basophils Relative: 1 %
Eosinophils Absolute: 0.1 K/uL (ref 0.0–0.5)
Eosinophils Relative: 3 %
HCT: 37 % (ref 36.0–46.0)
Hemoglobin: 12.7 g/dL (ref 12.0–15.0)
Immature Granulocytes: 0 %
Lymphocytes Relative: 48 %
Lymphs Abs: 1.4 K/uL (ref 0.7–4.0)
MCH: 27.9 pg (ref 26.0–34.0)
MCHC: 34.3 g/dL (ref 30.0–36.0)
MCV: 81.3 fL (ref 80.0–100.0)
Monocytes Absolute: 0.3 K/uL (ref 0.1–1.0)
Monocytes Relative: 10 %
Neutro Abs: 1.1 K/uL — ABNORMAL LOW (ref 1.7–7.7)
Neutrophils Relative %: 38 %
Platelet Count: 255 K/uL (ref 150–400)
RBC: 4.55 MIL/uL (ref 3.87–5.11)
RDW: 13.3 % (ref 11.5–15.5)
WBC Count: 2.9 K/uL — ABNORMAL LOW (ref 4.0–10.5)
nRBC: 0 % (ref 0.0–0.2)

## 2024-07-21 LAB — ABO/RH: ABO/RH(D): O POS

## 2024-07-21 NOTE — Assessment & Plan Note (Addendum)
 Positive rheumatoid factor test. No current symptoms of joint pain or stiffness, possibly due to recent weight loss. Family history of arthritis, but unclear if rheumatoid arthritis.   Anti-CCP antibody was negative on 03/18/2024.  Clinical picture not indicative of rheumatoid arthritis at this time.

## 2024-07-21 NOTE — Progress Notes (Signed)
 Tina Harris  HEMATOLOGY CLINIC PROGRESS NOTE  PATIENT NAME: Tina Harris   MR#: [id number] DOB: February 03, 1956  Patient Care Team: Tina Camie BRAVO, NP as PCP - General (Nurse Practitioner)  Date of visit: 07/21/2024   ASSESSMENT & PLAN:   Tina Harris is a 68 y.o. 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 No immature or abnormal cells observed.   Differential includes vitamin B12 deficiency, iron deficiency, folic acid  deficiency, autoimmune conditions, thyroid  dysfunction, viral infections (EBV, HIV, hepatitis), and benign neutropenia. No red flags such as abnormal red cell or platelet counts, or signs of bone marrow pathology.     Explained that viral infections can cause low WBC and vice versa.    Some individuals live with benign neutropenia without significant issues. Current findings do not indicate a serious underlying condition.   On her initial consultation with us  on 12/18/23, labs showed slightly improved white count of 3000 with ANC of 1400, normal differential.  Hemoglobin normal at 13.3, MCV 3.4.  Platelet count normal at 331,000.  CMP unremarkable.  Iron studies, B12, folate, methylmalonic acid, LDH were all within normal limits. ANA negative. Hepatitis panel, HIV were negative.  Flow cytometry of peripheral blood was unremarkable.  EBV IgG was positive at >600, indicating past infection.  Rheumatoid factor was positive at 273.8 international units/mL.  Anti-CCP antibodies were negative on 03/18/2024.  Clinical picture is not indicative of rheumatoid arthritis at this time.   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    Labs today reveal stable white count of 2900 with ANC of 1100.  Hemoglobin 12.7, platelet count  normal at 255,000.  Patient was provided reassurance.  She remains asymptomatic from this leukopenia.   - Schedule follow-up appointment in 6 months for repeat blood work.  If stable labs, we will space out clinic intervals further and consider even discharging her from our office.  - Advise to report any unexplained fevers, chills, night sweats, or recurrent infections  Rheumatoid factor positive Positive rheumatoid factor test. No current symptoms of joint pain or stiffness, possibly due to recent weight loss. Family history of arthritis, but unclear if rheumatoid arthritis.   Anti-CCP antibody was negative on 03/18/2024.  Clinical picture not indicative of rheumatoid arthritis at this time.  Healthcare maintenance Up to date on mammograms (she is getting it every two years) and colonoscopy (last in April 2023 with benign polyp removal and diagnosis of diverticulosis).  History of spider bite with residual leg pain and swelling Residual leg pain and swelling persist following a spider bite at the end of July, which required antibiotics for infection. Generalized achiness in joints and muscles is present, but no new joint pains have been reported. Symptoms are likely related to post-infectious inflammation. - Ensure hydration and healthy diet   I spent a total of 20 minutes during this encounter with the patient including review of chart and various tests results, discussions about plan of care and coordination of care plan.  I reviewed lab results and outside records for this visit and discussed relevant results with the patient. Diagnosis, plan of care and treatment options were also discussed in detail with the patient. Opportunity provided to ask questions and answers provided to her apparent satisfaction. Provided  instructions to call our clinic with any problems, questions or concerns prior to return visit. I recommended to continue follow-up with PCP and sub-specialists. She verbalized  understanding and agreed with the plan. No barriers to learning was detected.  Tina Harris, Tina Harris  07/21/2024 4:49 PM  [facility] - A DEPT OF [facility] [id number] [place] [address] Dept: [phone] Dept Fax: [phone]   CHIEF COMPLAINT/ REASON FOR VISIT:  Chronic Leukopenia, likely benign variant.   INTERVAL HISTORY:  Discussed the use of AI scribe software for clinical note transcription with the patient, who gave verbal consent to proceed.  History of Present Illness  Tina Harris is a 68 year old female who presents with persistent joint and muscle aches following a spider bite.  At the end of July, she experienced a severe spider bite on the back of her leg, which became infected and required antibiotic treatment. The bite caused significant pain and swelling, rendering her unable to walk. She continues to experience residual pain and swelling in her foot.  Since the spider bite, she has been experiencing persistent aches in her joints, muscles, neck, and back. She describes feeling 'like I'm a mess now' due to these symptoms. No new joint pains have developed since the bite.  No fevers, chills, night sweats, or infections such as coughs have occurred since the last visit. Her blood counts have remained stable, with a white blood cell count of 2,900.  Previous tests for rheumatoid arthritis and active Epstein-Barr virus infection were negative. She recalls having an old Epstein-Barr virus infection as a teenager.  She maintains a healthy lifestyle, staying hydrated and eating well, and takes a multivitamin tablet.    SUMMARY OF HEMATOLOGIC HISTORY:  She mentions a significant weight loss of over thirty pounds and about thirty-two inches due to a metabolic reset program she started in July 2024. The program involves a diet change to organic, natural foods, and a mindset change around food.  She also reports a significant improvement in her overall health and a decrease in joint pain since starting the program.    On 11/08/2023, routine labs at her PCPs office showed white count of 2600, ANC of 1100, ALC of 1200, normal differential.  Hemoglobin was normal at 12.8, hematocrit 40.4, MCV 86.  Platelet count normal at 324,000.  Previously labs in December 2023 showed white count of 3000 with ANC of 1200, labs in December 2022 showed white count of 3400, ANC of 1400.  Given persistent leukopenia, referral was sent to us  for further evaluation.   She reports recent infections including strep throat and COVID-19 in January 2025. The patient was not aware of low white count issue until recently, although it has been present since at least 2022. The patient's white blood cell count has decreased from 3400 in 2022 to 2600 in December 2024.    The patient has been following a strict diet and has lost a significant amount of weight. The patient also has GERD and has been trying to manage it naturally. The patient has been taking a variety of vitamin supplements since 2015 but stopped in August. The patient has not been having recurrent infections, fevers, chills, or night sweats. The patient's appetite is good and there are no unusual bleeding or bruising symptoms.   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 No immature or abnormal cells observed.  Differential includes vitamin B12 deficiency, iron deficiency, folic acid  deficiency, autoimmune conditions, thyroid  dysfunction, viral infections (EBV, HIV, hepatitis), and benign neutropenia. No red flags such as abnormal red cell or platelet counts, or signs of bone marrow pathology.    Recent viral infections (COVID-19, strep throat) could contribute.    Explained that viral infections can cause low WBC and vice versa.    Some individuals live with benign neutropenia without significant issues. Current  findings do not indicate a serious underlying condition.   On her initial consultation with us  on 12/18/23, labs showed slightly improved white count of 3000 with ANC of 1400, normal differential.  Hemoglobin normal at 13.3, MCV 3.4.  Platelet count normal at 331,000.  CMP unremarkable.  Iron studies, B12, folate, methylmalonic acid, LDH were all within normal limits. ANA negative. Hepatitis panel, HIV were negative.  Flow cytometry of peripheral blood was unremarkable.  EBV IgG was positive at >600, indicating past infection.  Rheumatoid factor was positive at 273.8 international units/mL.  Anti-CCP antibody was negative in May 2025.   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    I have reviewed the past medical history, past surgical history, social history and family history with the patient and they are unchanged from previous note.  ALLERGIES: She has no known allergies.  MEDICATIONS:  No current outpatient medications on file.   No current facility-administered medications for this visit.     REVIEW OF SYSTEMS:    Review of Systems - Oncology  All other pertinent systems were reviewed with the patient and are negative.  PHYSICAL EXAMINATION:   Onc Performance Status - 07/21/24 1447       ECOG Perf Status   ECOG Perf Status Restricted in physically strenuous activity but ambulatory and able to carry out work of a light or sedentary nature, e.g., light house work, office work      KPS SCALE   KPS % SCORE Normal activity with effort, some s/s of disease           Vitals:   07/21/24 1445  BP: 123/62  Pulse: (!) 58  Resp: 17  Temp: (!) 97.3 F (36.3 C)  SpO2: 100%    Filed Weights   07/21/24 1445  Weight: 159 lb 8 oz (72.3 kg)     Physical Exam Constitutional:      General: She is not in acute distress.    Appearance: Normal appearance.  HENT:     Head: Normocephalic and atraumatic.  Eyes:     General: No scleral icterus.     Conjunctiva/sclera: Conjunctivae normal.  Cardiovascular:     Rate and Rhythm: Normal rate and regular rhythm.     Heart sounds: Normal heart sounds.  Pulmonary:     Effort: Pulmonary effort is normal.     Breath sounds: Normal breath sounds.  Abdominal:     General: There is no distension.  Musculoskeletal:     Right lower leg: No edema.     Left lower leg: No edema.  Neurological:     General: No focal deficit present.     Mental Status: She is alert and oriented to person, place, and time.  Psychiatric:        Mood and Affect: Mood normal.        Behavior: Behavior normal.        Thought Content: Thought content normal.      LABORATORY DATA:   I have reviewed the data as listed.  Results for orders placed or performed in visit on 07/21/24  CBC with Differential ([facility])  Result Value Ref Range   WBC Count 2.9 (L) 4.0 - 10.5 K/uL   RBC 4.55 3.87 - 5.11 MIL/uL   Hemoglobin 12.7 12.0 - 15.0 g/dL   HCT 62.9 63.9 - 53.9 %   MCV 81.3 80.0 - 100.0 fL   MCH 27.9 26.0 - 34.0 pg   MCHC 34.3 30.0 - 36.0 g/dL   RDW 86.6 88.4 - 84.4 %   Platelet Count 255 150 - 400 K/uL   nRBC 0.0 0.0 - 0.2 %   Neutrophils Relative % 38 %   Neutro Abs 1.1 (L) 1.7 - 7.7 K/uL   Lymphocytes Relative 48 %   Lymphs Abs 1.4 0.7 - 4.0 K/uL   Monocytes Relative 10 %   Monocytes Absolute 0.3 0.1 - 1.0 K/uL   Eosinophils Relative 3 %   Eosinophils Absolute 0.1 0.0 - 0.5 K/uL   Basophils Relative 1 %   Basophils Absolute 0.0 0.0 - 0.1 K/uL   Immature Granulocytes 0 %   Abs Immature Granulocytes 0.01 0.00 - 0.07 K/uL     RADIOGRAPHIC STUDIES:  No recent pertinent imaging studies available to review.  Orders Placed This Encounter  Procedures   CBC with Differential ([facility])    Standing Status:   Future    Expected Date:   01/18/2025    Expiration Date:   07/21/2025     Future Appointments  Date Time Provider Department [facility]  08/15/2024  2:00 PM [facility] 1 [facility] [address]  11/18/2024 10:45 AM Early, Camie BRAVO, NP [facility] [address]  01/19/2025  2:30 PM [facility]C PHLEBOTOMIST [facility] None  01/19/2025  2:45 PM [place], [place], Tina Harris [facility] None  05/05/2025  2:50 PM [facility]-ANNUAL WELLNESS VISIT [facility] [address]     This document was completed utilizing speech recognition software. Grammatical errors, random word insertions, pronoun errors, and incomplete sentences are an occasional consequence of this system due to software limitations, ambient noise, and hardware issues. Any formal questions or concerns about the content, text or information contained within the body of this dictation should be directly addressed to the provider for clarification.

## 2024-07-21 NOTE — Assessment & Plan Note (Addendum)
 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 No immature or abnormal cells observed.   Differential includes vitamin B12 deficiency, iron deficiency, folic acid  deficiency, autoimmune conditions, thyroid  dysfunction, viral infections (EBV, HIV, hepatitis), and benign neutropenia. No red flags such as abnormal red cell or platelet counts, or signs of bone marrow pathology.     Explained that viral infections can cause low WBC and vice versa.    Some individuals live with benign neutropenia without significant issues. Current findings do not indicate a serious underlying condition.   On her initial consultation with us  on 12/18/23, labs showed slightly improved white count of 3000 with ANC of 1400, normal differential.  Hemoglobin normal at 13.3, MCV 3.4.  Platelet count normal at 331,000.  CMP unremarkable.  Iron studies, B12, folate, methylmalonic acid, LDH were all within normal limits. ANA negative. Hepatitis panel, HIV were negative.  Flow cytometry of peripheral blood was unremarkable.  EBV IgG was positive at >600, indicating past infection.  Rheumatoid factor was positive at 273.8 international units/mL.  Anti-CCP antibodies were negative on 03/18/2024.  Clinical picture is not indicative of rheumatoid arthritis at this time.   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    Labs today reveal stable white count of 2900 with ANC of 1100.  Hemoglobin 12.7, platelet count normal at 255,000.  Patient was provided reassurance.  She remains asymptomatic from this leukopenia.   - Schedule follow-up appointment in 6 months for repeat blood work.  If stable labs, we will space out clinic intervals further and consider even discharging her from our office.  - Advise to report any unexplained fevers, chills, night sweats, or recurrent infections

## 2024-07-21 NOTE — Assessment & Plan Note (Signed)
 Up to date on mammograms (she is getting it every two years) and colonoscopy (last in April 2023 with benign polyp removal and diagnosis of diverticulosis).

## 2024-07-22 ENCOUNTER — Other Ambulatory Visit: Payer: Self-pay

## 2024-07-22 DIAGNOSIS — M810 Age-related osteoporosis without current pathological fracture: Secondary | ICD-10-CM

## 2024-07-22 NOTE — Telephone Encounter (Unsigned)
 Copied from CRM [id number]. Topic: Clinical - Request for Lab/Test Order >> Jul 22, 2024  1:35 PM Myrick T wrote: Reason for CRM: patient called stated [facility] stated they no longer do DG Bone Density Screenings. Please send the order to a different location. Patient said she would prefer to go to [facility] if they do the screening

## 2024-07-23 ENCOUNTER — Telehealth: Payer: Self-pay

## 2024-07-23 NOTE — Telephone Encounter (Signed)
 I sent the order to [facility] yesterday.    Copied from CRM [id number]. Topic: Clinical - Request for Lab/Test Order >> Jul 22, 2024  1:35 PM Myrick T wrote: Reason for CRM: patient called stated [facility] stated they no longer do DG Bone Density Screenings. Please send the order to a different location. Patient said she would prefer to go to [facility] if they do the screening >> Jul 23, 2024 10:09 AM Gustabo D wrote: Patient is calling to get the status of this she can be reached on Mychart

## 2024-08-15 ENCOUNTER — Encounter (HOSPITAL_BASED_OUTPATIENT_CLINIC_OR_DEPARTMENT_OTHER): Admitting: Radiology

## 2024-08-15 DIAGNOSIS — Z1231 Encounter for screening mammogram for malignant neoplasm of breast: Secondary | ICD-10-CM

## 2024-11-14 ENCOUNTER — Ambulatory Visit (HOSPITAL_BASED_OUTPATIENT_CLINIC_OR_DEPARTMENT_OTHER)
Admission: RE | Admit: 2024-11-14 | Discharge: 2024-11-14 | Disposition: A | Discharge status: Home / Self Care | Source: Ambulatory Visit | Attending: Nurse Practitioner | Admitting: Nurse Practitioner

## 2024-11-14 DIAGNOSIS — M810 Age-related osteoporosis without current pathological fracture: Secondary | ICD-10-CM | POA: Diagnosis present

## 2024-11-17 ENCOUNTER — Ambulatory Visit: Payer: Self-pay | Admitting: Nurse Practitioner

## 2024-11-18 ENCOUNTER — Ambulatory Visit (INDEPENDENT_AMBULATORY_CARE_PROVIDER_SITE_OTHER): Payer: Self-pay | Admitting: Nurse Practitioner

## 2024-11-18 ENCOUNTER — Encounter: Payer: Medicare PPO | Admitting: Nurse Practitioner

## 2024-11-18 ENCOUNTER — Encounter: Payer: Self-pay | Admitting: Nurse Practitioner

## 2024-11-18 VITALS — BP 116/78 | HR 72 | Ht 65.5 in | Wt 166.4 lb

## 2024-11-18 DIAGNOSIS — Z0000 Encounter for general adult medical examination without abnormal findings: Secondary | ICD-10-CM | POA: Diagnosis not present

## 2024-11-18 DIAGNOSIS — M810 Age-related osteoporosis without current pathological fracture: Secondary | ICD-10-CM

## 2024-11-18 DIAGNOSIS — H9313 Tinnitus, bilateral: Secondary | ICD-10-CM

## 2024-11-18 DIAGNOSIS — Z23 Encounter for immunization: Secondary | ICD-10-CM

## 2024-11-18 DIAGNOSIS — R21 Rash and other nonspecific skin eruption: Secondary | ICD-10-CM | POA: Diagnosis not present

## 2024-11-18 DIAGNOSIS — D72819 Decreased white blood cell count, unspecified: Secondary | ICD-10-CM

## 2024-11-18 MED ORDER — ROMOSOZUMAB-AQQG 105 MG/1.17ML ~~LOC~~ SOSY: 210.0000 mg | PREFILLED_SYRINGE | Freq: Once | SUBCUTANEOUS | 0 refills | Status: AC

## 2024-11-18 NOTE — Assessment & Plan Note (Addendum)
 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 Previous treatment with Evenity  discussed, with concerns about cardiovascular risks. Insurance coverage and preferences to be considered for treatment options. Evenity  is effective for bone rebuilding but has potential side effects including stroke and heart attack, which are contraindicated in high cardiovascular risk patients. - Checked calcium  and vitamin D  levels before starting Evenity . - Will coordinate with pharmacist to determine insurance preferences for osteoporosis treatment. - Will consider Evenity  for bone rebuilding, followed by bisphosphonates for maintenance. Orders:   CMP14+EGFR   TSH   VITAMIN D  25 Hydroxy (Vit-D Deficiency, Fractures)   Parathyroid  hormone, intact (no Ca)

## 2024-11-18 NOTE — Addendum Note (Signed)
 Addended by: ORIS DANKER E on: 11/18/2024 07:29 PM   Modules accepted: Orders

## 2024-11-18 NOTE — Progress Notes (Signed)
 Pneumonia Vaccine needs to get at pharmacy, Influenza Vaccine Scheduled., and Tetanus (TDAP) Vaccine Scheduled.  Catheline Doing, DNP, AGNP-c [facility] [address] [place], [place] [address] Main Office [phone] VISIT TYPE: CPE on 11/18/2024 There were no vitals filed for this visit. There is no height or weight on file to calculate BMI.  Wt Readings from Last 3 Encounters:  07/21/24 159 lb 8 oz (72.3 kg)  03/18/24 156 lb 14.4 oz (71.2 kg)  12/18/23 152 lb 1.6 oz (69 kg)     Subjective:  No chief complaint on file.  History of Present Illness Tina Harris is a 69 year old female who presents for her annual exam. She has concerns with a recurring rash and tinnitus.  She has been experiencing a recurring rash since October, which appears on her knees, wrists, hands, and abdomen. The rash is described as hot and itchy, lasting all day before disappearing and reappearing after a few days. She has attempted dietary changes, such as eliminating nuts, without success. The only new introduction into her daily habits are CMOS tablets she started a month before the rash began. She has not tried to stop these, but will do so. She has no new potential environmental triggers.  She has a history of taking antihistamines for seasonal allergies, which did not alleviate the rash.   She reports experiencing tinnitus, particularly noticeable at night when it is quiet. She has no history of ear infections or significant hearing loss. The tinnitus became more apparent after she stopped leaving the radio on at night. No recent changes in her environment that could contribute to this symptom. She did recently stop her allergy medication and shortly thereafter noted the tinnitus.   She has a history of osteoporosis. She experiences discomfort in her hips and back, particularly when standing for extended periods, and has difficulty sleeping due to pressure in these areas. She was on  Nexium for a long time, which may affect calcium  and vitamin D  absorption, but has stopped this. Her last DEXA showed worsening osteoporosis. She is open to management.   She recalls a severe spider bite in July that caused significant pain and [place]. She questions whether this could be related to her rash.   She has a history of leukopenia, with her white blood cell count previously low. No symptoms of infection and her platelet counts are normal. She has a positive rheumatoid factor but no diagnosis of rheumatoid arthritis. She is seeing hematology every 6 months for ongoing evaluation to ensure stable.    Pertinent items are noted in HPI.     04/29/2024    2:52 PM 12/18/2023    2:44 PM 04/24/2023    2:39 PM 11/07/2022   11:28 AM 01/18/2022    2:26 PM  Depression screen PHQ 2/9  Decreased Interest 0 0 0 0 0  Down, Depressed, Hopeless 0 0 0 0 0  PHQ - 2 Score 0 0 0 0 0  Altered sleeping 0  1    Tired, decreased energy 0  0    Change in appetite 0  0    Feeling bad or failure about yourself  0  0    Trouble concentrating 0  0    Moving slowly or fidgety/restless 0  0    Suicidal thoughts 0  0    PHQ-9 Score 0   1     Difficult doing work/chores Not difficult at all  Not difficult at all       Data saved with a previous flowsheet row definition        No data to display             04/25/2024   11:20 AM 11/08/2023   11:04 AM 04/20/2023    1:40 PM 11/07/2022   11:27 AM 01/18/2022    2:26 PM  Fall Risk   Falls in the past year? 0   0  0 0  Number falls in past yr: 0  0 0  0   Injury with Fall? 0   0  0  0    Risk for fall due to : No Fall Risks No Fall Risks No Fall Risks No Fall Risks   Follow up Falls prevention discussed;Falls evaluation completed  Falls prevention discussed;Education provided;Falls evaluation completed Falls evaluation completed       Manually entered by patient   Data saved with a previous flowsheet row  definition   Past medical history, surgical history, medications, allergies, family history and social history reviewed with patient today and changes made to appropriate areas of the chart.      Objective:    Physical Exam Vitals and nursing note reviewed.  Constitutional:      General: She is not in acute distress.    Appearance: Normal appearance.  HENT:     Head: Normocephalic and atraumatic.     Right Ear: Hearing, tympanic membrane, ear canal and external ear normal.     Left Ear: Hearing, tympanic membrane, ear canal and external ear normal.     Nose: Nose normal.     Right Sinus: No maxillary sinus tenderness or frontal sinus tenderness.     Left Sinus: No maxillary sinus tenderness or frontal sinus tenderness.     Mouth/Throat:     Lips: Pink.     Mouth: Mucous membranes are moist.     Pharynx: Oropharynx is clear.  Eyes:     General: Lids are normal. Vision grossly intact.     Extraocular Movements: Extraocular movements intact.     Conjunctiva/sclera: Conjunctivae normal.     Pupils: Pupils are equal, round, and reactive to light.     Funduscopic exam:    Right eye: Red reflex present.        Left eye: Red reflex present.    Visual Fields: Right eye visual fields normal and left eye visual fields normal.  Neck:     Thyroid : No thyromegaly.     Vascular: No carotid bruit.  Cardiovascular:     Rate and Rhythm: Normal rate and regular rhythm.     Chest Wall: PMI is not displaced.     Pulses: Normal pulses.          Dorsalis pedis pulses are 2+ on the right side and 2+ on the left side.       Posterior tibial pulses are 2+ on the right side and 2+ on the left side.     Heart sounds: Normal heart sounds. No murmur heard. Pulmonary:     Effort: Pulmonary effort is normal. No respiratory distress.     Breath sounds: Normal breath sounds.  Abdominal:     General: Abdomen is flat. Bowel sounds are normal. There is no distension.     Palpations: Abdomen is soft. There  is no hepatomegaly, splenomegaly or mass.     Tenderness: There is no abdominal tenderness. There is no right CVA tenderness, left CVA  tenderness, guarding or rebound.  Musculoskeletal:        General: Normal range of motion.     Cervical back: Full passive range of motion without pain, normal range of motion and neck supple. No tenderness.     Right lower leg: No edema.     Left lower leg: No edema.  Feet:     Left foot:     Toenail Condition: Left toenails are normal.  Lymphadenopathy:     Cervical: No cervical adenopathy.     Upper Body:     Right upper body: No supraclavicular adenopathy.     Left upper body: No supraclavicular adenopathy.  Skin:    General: Skin is warm and dry.     Capillary Refill: Capillary refill takes less than 2 seconds.     Nails: There is no clubbing.  Neurological:     General: No focal deficit present.     Mental Status: She is alert and oriented to person, place, and time.     GCS: GCS eye subscore is 4. GCS verbal subscore is 5. GCS motor subscore is 6.     Sensory: Sensation is intact.     Motor: Motor function is intact.     Coordination: Coordination is intact.     Gait: Gait is intact.     Deep Tendon Reflexes: Reflexes are normal and symmetric.  Psychiatric:        Attention and Perception: Attention normal.        Mood and Affect: Mood normal.        Speech: Speech normal.        Behavior: Behavior normal. Behavior is cooperative.        Thought Content: Thought content normal.        Cognition and Memory: Cognition and memory normal.        Judgment: Judgment normal.         Assessment & Plan:   Assessment & Plan Encounter for annual physical exam CPE completed today. Review of HM activities and recommendations discussed and provided on AVS. Anticipatory guidance, diet, and exercise recommendations provided. Medications, allergies, and hx reviewed and updated as necessary. Orders placed as listed below.  Plan: - Labs ordered.  Will make changes as necessary based on results.  - I will review these results and send recommendations via MyChart or a telephone call.  - F/U with CPE in 1 year or sooner for acute/chronic health needs as directed.      Rash Intermittent rash since October, affecting knees, wrists, hands, and abdomen, with hot and itchy characteristics. Images of the rash today reviewed with the patient on her phone. Photos are consistent with localized hives. Possible allergic response, potentially related to dietary factors or supplements. No new environmental changes or personal care products identified. Previous antihistamines ineffective. Recommend discontinuation of CMOS to determine if this could be a contributor. No breathing affected. Consider autoimmune or allergen work-up if symptoms persist. Discussed that the symptoms are very unlikely to be related to insect bite several months earlier.  - Discontinue current supplement to assess for resolution of symptoms. - Consider trial of hydroxyzine for symptomatic relief. - Maintain a food and supplement log to identify potential triggers. - Will consider allergist referral if symptoms persist after dietary modifications. Orders:   CBC with Differential/Platelet   CMP14+EGFR  Need for influenza vaccination  Orders:   Flu vaccine HIGH DOSE PF(Fluzone Trivalent)  Tinnitus of both ears New onset tinnitus, particularly noticeable at  night. No ear infections or significant noise exposure history. Possible relation to allergies or environmental factors. No abnormalities in ear examination. Wheezing noted on lungs opening the question for possible allergy involvement.  - Restart allergy medication to assess for improvement in tinnitus. - Monitor symptoms and will consider audiologist referral if tinnitus persists.    Age-related osteoporosis without current pathological fracture Significant bone loss in lumbar spine and femoral neck. T-score indicates  severe bone loss. Previous treatment with Evenity  discussed, with concerns about cardiovascular risks. Insurance coverage and preferences to be considered for treatment options. Evenity  is effective for bone rebuilding but has potential side effects including stroke and heart attack, which are contraindicated in high cardiovascular risk patients. - Checked calcium  and vitamin D  levels before starting Evenity . - Will coordinate with pharmacist to determine insurance preferences for osteoporosis treatment. - Will consider Evenity  for bone rebuilding, followed by bisphosphonates for maintenance. Orders:   CMP14+EGFR   TSH   VITAMIN D  25 Hydroxy (Vit-D Deficiency, Fractures)   Parathyroid  hormone, intact (no Ca)  Healthcare maintenance  Orders:   CBC with Differential/Platelet   CMP14+EGFR   Lipid panel   TSH   VITAMIN D  25 Hydroxy (Vit-D Deficiency, Fractures)  Leukopenia, unspecified type Previous viral infections. White blood cell count trending upwards. No evidence of serious underlying condition. Positive rheumatoid factor noted, but no rheumatoid arthritis diagnosed. - Continue monitoring white blood cell count. - Will reassess in March to evaluate trends and determine need for further workup.    NEXT PREVENTATIVE PHYSICAL DUE IN 1 YEAR.    PATIENT COUNSELING PROVIDED FOR ALL ADULT PATIENTS: A well balanced diet low in saturated fats, cholesterol, and moderation in carbohydrates.  This can be as simple as monitoring portion sizes and cutting back on sugary beverages such as soda and juice to start with.    Daily water consumption of at least 64 ounces.  Physical activity at least 180 minutes per week.  If just starting out, start 10 minutes a day and work your way up.   This can be as simple as taking the stairs instead of the elevator and walking 2-3 laps around the office  purposefully every day.   STD protection, partner selection, and regular testing if high  risk.  Limited consumption of alcoholic beverages if alcohol is consumed. For men, I recommend no more than 14 alcoholic beverages per week, spread out throughout the week (max 2 per day). Avoid binge drinking or consuming large quantities of alcohol in one setting.  Please let me know if you feel you may need help with reduction or quitting alcohol consumption.   Avoidance of nicotine, if used. Please let me know if you feel you may need help with reduction or quitting nicotine use.   Daily mental health attention. This can be in the form of 5 minute daily meditation, prayer, journaling, yoga, reflection, etc.  Purposeful attention to your emotions and mental state can significantly improve your overall wellbeing  and Health.  Please know that I am here to help you with all of your health care goals and am happy to work with you to find a solution that works best for you.  The greatest advice I have received with any changes in life are to take it one step at a time, that even means if all you can focus on is the next 60 seconds, then do that and celebrate your victories.  With any changes in life, you will have set backs, and  that is OK. The important thing to remember is, if you have a set back, it is not a failure, it is an opportunity to try again! Screening Testing Mammogram Every 1 -2 years based on history and risk factors Starting at age 46 Pap Smear Ages 21-39 every 3 years Ages 54-65 every 5 years with HPV testing More frequent testing may be required based on results and history Colon Cancer Screening Every 1-10 years based on test performed, risk factors, and history Starting at age 70 Bone Density Screening Every 2-10 years based on history Starting at age 85 for women Recommendations for men differ based on medication usage, history, and risk factors AAA Screening One time ultrasound Men 50-52 years old who have every smoked Lung Cancer Screening Low Dose Lung CT  every 12 months Age 85-80 years with a 30 pack-year smoking history who still smoke or who have quit within the last 15 years   Screening Labs Routine  Labs: Complete Blood Count (CBC), Complete Metabolic Panel (CMP), Cholesterol (Lipid Panel) Every 6-12 months based on history and medications May be recommended more frequently based on current conditions or previous results Hemoglobin A1c Lab Every 3-12 months based on history and previous results Starting at age 49 or earlier with diagnosis of diabetes, high cholesterol, BMI >26, and/or risk factors Frequent monitoring for patients with diabetes to ensure blood sugar control Thyroid  Panel (TSH) Every 6 months based on history, symptoms, and risk factors May be repeated more often if on medication HIV One time testing for all patients 72 and older May be repeated more frequently for patients with increased risk factors or exposure Hepatitis C One time testing for all patients 70 and older May be repeated more frequently for patients with increased risk factors or exposure Gonorrhea, Chlamydia Every 12 months for all sexually active persons 13-24 years Additional monitoring may be recommended for those who are considered high risk or who have symptoms Every 12 months for any woman on birth control, regardless of sexual activity PSA Men 41-108 years old with risk factors Additional screening may be recommended from age 33-69 based on risk factors, symptoms, and history  Vaccine Recommendations Tetanus Booster All adults every 10 years Flu Vaccine All patients 6 months and older every year COVID Vaccine All patients 12 years and older Initial dosing with booster May recommend additional booster based on age and health history HPV Vaccine 2 doses all patients age 49-26 Dosing may be considered for patients over 26 Shingles Vaccine (Shingrix) 2 doses all adults 55 years and older Pneumonia (Pneumovax 83) All adults 65 years and  older May recommend earlier dosing based on health history One year apart from Prevnar 73 Pneumonia (Prevnar 24) All adults 65 years and older Dosed 1 year after Pneumovax 23 Pneumonia (Prevnar 20) One time alternative to the two dosing of 13 and 23 For all adults with initial dose of 23, 20 is recommended 1 year later For all adults with initial dose of 13, 23 is still recommended as second option 1 year later

## 2024-11-18 NOTE — Assessment & Plan Note (Addendum)
" °  Orders:   CBC with Differential/Platelet   CMP14+EGFR   Lipid panel   TSH   VITAMIN D  25 Hydroxy (Vit-D Deficiency, Fractures)  "

## 2024-11-18 NOTE — Patient Instructions (Addendum)
 If the rash is not improving when you stop the supplement, please let me know and we can send a referral for an allergist evaluation.   Restart the allergy medication to see if the ringing in the ears improves.   I will work with the pharmacist to see what we can get approved to help with the osteoporosis.   Tinnitus Tinnitus is when you hear a sound that there's no actual source for. It may sound like ringing in your ears or something else. The sound may be loud, soft, or somewhere in between. It can last for a few seconds or be constant for days. It can come and go. Almost everyone has tinnitus at some point. It's not the same as hearing loss. But you may need to see a health care provider if: It lasts for a long time. It comes back often. You have trouble sleeping and focusing. What are the causes? The cause of tinnitus is often unknown. In some cases, you may get it if: You're around loud noises, such as from machines or music. An object gets stuck in your ear. Earwax builds up in landscape architect. You drink a lot of alcohol or caffeine. You take certain medicines. You start to lose your hearing. You may also get it from some medical conditions. These may include: Ear or sinus infections. Heart diseases. High blood pressure. Allergies. Mnire's disease. Problems with your thyroid . A tumor. This is a growth of cells that isn't normal. A weak, bulging blood vessel called an aneurysm near your ear. What increases the risk? You may be more likely to get tinnitus if: You're around loud noises a lot. You're older. You drink alcohol. You smoke. What are the signs or symptoms? The main symptom is hearing a sound that there's no source for. It may sound like ringing. It may also sound like: Buzzing. Sizzling. Blowing air. Hissing. Whistling. Other sounds may include: Roaring. Running water. A musical note. Tapping. Humming. You may have symptoms in one ear or both ears. How is  this diagnosed? Tinnitus is diagnosed based on your symptoms, your medical history, and an exam. Your provider may do a full hearing test if your tinnitus: Is in just one ear. Makes it hard for you to hear. Lasts 6 months or longer. You may also need to see an expert in hearing disorders called an audiologist. They may ask you about your symptoms and how tinnitus affects your daily life. You may have other tests done. These may include: A CT scan. An MRI. An angiogram. This shows how blood flows through your blood vessels. How is this treated? Treatment may include: Therapy to help you manage the stress of living with tinnitus. Finding ways to mask or cover the sound of tinnitus. These include: Sound or white noise machines. Devices that fit in your ear and play sounds or music. A loud humidifier. Acoustic neural stimulation. This is when you use headphones to listen to music that has a special signal in it. Over time, this signal may change some of the pathways in your brain. This can make you less sensitive to tinnitus. This treatment is used for very severe cases. Using hearing aids or cochlear implants if your tinnitus is from hearing loss. If your tinnitus is caused by a medical condition, treating the condition may make it go away.  Follow these instructions at home: Managing symptoms     Try to avoid being in loud places or around loud noises. Wear earplugs or  headphones when you're around loud noises. Find ways to reduce stress. These may include meditation, yoga, or deep breathing. Sleep with your head slightly raised. General instructions Take over-the-counter and prescription medicines only as told by your provider. Track the things that cause symptoms (triggers). Try to avoid these things. To stop your tinnitus from getting worse: Do not drink alcohol. Do not have caffeine. Do not use any products that contain nicotine or tobacco. These products include cigarettes,  chewing tobacco, and vaping devices, such as e-cigarettes. If you need help quitting, ask your provider. Avoid using too much salt. Get enough sleep each night. Where to find more information American Tinnitus Association: https://www.johnson-hamilton.org/ Contact a health care provider if: Your symptoms last for 3 weeks or longer without stopping. You have sudden hearing loss. Your symptoms get worse or don't get better with home care. You can't manage the stress of living with tinnitus. Get help right away if: You get tinnitus after a head injury. You have tinnitus and: Dizziness. Nausea and vomiting. Loss of balance. A sudden, severe headache. Changes to your eyesight. Weakness in your face, arms, or legs. These symptoms may be an emergency. Get help right away. Call 911. Do not wait to see if the symptoms will go away. Do not drive yourself to the hospital. This information is not intended to replace advice given to you by your health care provider. Make sure you discuss any questions you have with your health care provider. Document Revised: 02/05/2023 Document Reviewed: 02/05/2023 Elsevier Patient Education  2024 [employer].

## 2024-11-18 NOTE — Assessment & Plan Note (Signed)
 Previous viral infections. White blood cell count trending upwards. No evidence of serious underlying condition. Positive rheumatoid factor noted, but no rheumatoid arthritis diagnosed. - Continue monitoring white blood cell count. - Will reassess in March to evaluate trends and determine need for further workup.

## 2024-11-19 ENCOUNTER — Ambulatory Visit: Payer: Self-pay | Admitting: Nurse Practitioner

## 2024-11-19 LAB — CBC WITH DIFFERENTIAL/PLATELET
Basophils Absolute: 0 x10E3/uL (ref 0.0–0.2)
Basos: 0 %
EOS (ABSOLUTE): 0 x10E3/uL (ref 0.0–0.4)
Eos: 2 %
Hematocrit: 39.4 % (ref 34.0–46.6)
Hemoglobin: 12.7 g/dL (ref 11.1–15.9)
Immature Grans (Abs): 0 x10E3/uL (ref 0.0–0.1)
Immature Granulocytes: 0 %
Lymphocytes Absolute: 1.3 x10E3/uL (ref 0.7–3.1)
Lymphs: 51 %
MCH: 28.1 pg (ref 26.6–33.0)
MCHC: 32.2 g/dL (ref 31.5–35.7)
MCV: 87 fL (ref 79–97)
Monocytes Absolute: 0.2 x10E3/uL (ref 0.1–0.9)
Monocytes: 9 %
Neutrophils Absolute: 1 x10E3/uL — ABNORMAL LOW (ref 1.4–7.0)
Neutrophils: 38 %
Platelets: 297 x10E3/uL (ref 150–450)
RBC: 4.52 x10E6/uL (ref 3.77–5.28)
RDW: 13.4 % (ref 11.7–15.4)
WBC: 2.5 x10E3/uL — CL (ref 3.4–10.8)

## 2024-11-19 LAB — CMP14+EGFR
ALT: 13 IU/L (ref 0–32)
AST: 19 IU/L (ref 0–40)
Albumin: 4.7 g/dL (ref 3.9–4.9)
Alkaline Phosphatase: 82 IU/L (ref 49–135)
BUN/Creatinine Ratio: 15 (ref 12–28)
BUN: 13 mg/dL (ref 8–27)
Bilirubin Total: 0.5 mg/dL (ref 0.0–1.2)
CO2: 26 mmol/L (ref 20–29)
Calcium: 9.6 mg/dL (ref 8.7–10.3)
Chloride: 102 mmol/L (ref 96–106)
Creatinine, Ser: 0.85 mg/dL (ref 0.57–1.00)
Globulin, Total: 2.4 g/dL (ref 1.5–4.5)
Glucose: 86 mg/dL (ref 70–99)
Potassium: 4.1 mmol/L (ref 3.5–5.2)
Sodium: 141 mmol/L (ref 134–144)
Total Protein: 7.1 g/dL (ref 6.0–8.5)
eGFR: 75 mL/min/1.73

## 2024-11-19 LAB — LIPID PANEL
Chol/HDL Ratio: 3 ratio (ref 0.0–4.4)
Cholesterol, Total: 260 mg/dL — ABNORMAL HIGH (ref 100–199)
HDL: 88 mg/dL
LDL Chol Calc (NIH): 159 mg/dL — ABNORMAL HIGH (ref 0–99)
Triglycerides: 78 mg/dL (ref 0–149)
VLDL Cholesterol Cal: 13 mg/dL (ref 5–40)

## 2024-11-19 LAB — PARATHYROID HORMONE, INTACT (NO CA): PTH: 25 pg/mL (ref 15–65)

## 2024-11-19 LAB — TSH: TSH: 2.96 u[IU]/mL (ref 0.450–4.500)

## 2024-11-19 LAB — VITAMIN D 25 HYDROXY (VIT D DEFICIENCY, FRACTURES): Vit D, 25-Hydroxy: 53.4 ng/mL (ref 30.0–100.0)

## 2024-11-20 ENCOUNTER — Encounter: Payer: Self-pay | Admitting: Nurse Practitioner

## 2024-11-20 ENCOUNTER — Other Ambulatory Visit (HOSPITAL_COMMUNITY): Payer: Self-pay

## 2024-11-20 ENCOUNTER — Other Ambulatory Visit (HOSPITAL_COMMUNITY): Payer: Self-pay | Admitting: Nurse Practitioner

## 2024-11-20 ENCOUNTER — Encounter (HOSPITAL_COMMUNITY): Payer: Self-pay | Admitting: Nurse Practitioner

## 2024-11-20 ENCOUNTER — Telehealth: Payer: Self-pay

## 2024-11-20 NOTE — Telephone Encounter (Signed)
Evenity order placed.

## 2024-11-20 NOTE — Telephone Encounter (Signed)
 Evenity  VOB initiated via [email or web address]  Last OV:  Next OV:  Last Evenity  inj:  Next Evenity  inj DUE: NEW START

## 2024-11-21 ENCOUNTER — Telehealth (HOSPITAL_COMMUNITY): Payer: Self-pay | Admitting: Pharmacy Technician

## 2024-11-21 NOTE — Telephone Encounter (Signed)
 Auth Submission: APPROVED Site of care: [facility] Payer: [employer] MEDICARE Medication & CPT/J Code(s) submitted: Evenity  (Romosozumab ) [id number] Diagnosis Code: M81.0 Route of submission (phone, fax, portal): CMM Phone # Fax # Auth type: Buy/Bill HB Units/visits requested: 210MG  once a month Reference number: [id number] Approval from: 11/20/24 to 11/12/25    Dagoberto Armour, CPhT [facility] Phone: [phone] 11/21/2024

## 2024-11-26 ENCOUNTER — Other Ambulatory Visit (HOSPITAL_COMMUNITY): Payer: Self-pay

## 2024-11-26 NOTE — Telephone Encounter (Addendum)
 MEDICAL PA SUBMITTED VIA LATENT. KEY: [id number]

## 2024-11-26 NOTE — Telephone Encounter (Signed)
 SABRA

## 2024-11-27 ENCOUNTER — Telehealth: Payer: Self-pay

## 2024-11-27 ENCOUNTER — Other Ambulatory Visit (HOSPITAL_COMMUNITY): Payer: Self-pay

## 2024-11-27 ENCOUNTER — Other Ambulatory Visit: Payer: Self-pay | Admitting: Nurse Practitioner

## 2024-11-27 DIAGNOSIS — M810 Age-related osteoporosis without current pathological fracture: Secondary | ICD-10-CM

## 2024-11-27 NOTE — Telephone Encounter (Signed)
 Looks like patient has an appointment there tomorrow for Evenity .

## 2024-11-27 NOTE — Telephone Encounter (Signed)
 Pt ready for scheduling for EVENITY  on or after : 11/27/24  Option# 1 Buy/Bill (Office supplied medication)  Out-of-pocket cost due at time of  office visit: $537  Number of injection/visits approved: 12  Primary: [employer] Evenity  co-insurance: 20% Admin fee co-insurance: $55  Secondary: --- Evenity  co-insurance:  Admin fee co-insurance:   Medical Benefit Details: Date Benefits were checked: 11/24/24 Deductible: NO/ Coinsurance: 20%/ Admin Fee: $55  Prior Auth: APPROVED PA# [id number] Expiration Date: 11/27/24-11/12/25   # of doses approved: 12 ------------------------------------------------------------------------- Option# 2- Med Obtained from pharmacy  Pharmacy benefit: Copay $--- (Paid to pharmacy) Admin Fee: --- (Pay at clinic)  Prior Auth: --- PA# Expiration Date:   # of doses approved:  If patient wants fill through the pharmacy benefit please send prescription to: ---, and include estimated need by date in rx notes. Pharmacy will ship medication directly to the office.  Patient NOT eligible for Evenity  Copay Card. Copay Card can make patient's cost as little as $25. Link to apply: [email or web address]://[email or web address]   This summary of benefits is an estimation of the patient's out-of-pocket cost. Exact cost may very based on individual plan coverage.

## 2024-11-27 NOTE — Telephone Encounter (Signed)
 This encounter was created in error - please disregard.  This encounter was created in error - please disregard.

## 2024-11-27 NOTE — Telephone Encounter (Signed)
 Copied from CRM [id number]. Topic: Clinical - Prescription Issue >> Nov 27, 2024  4:51 PM Hadassah PARAS wrote: Reason for CRM: Felipe from [facility] is calling to receive clarification on directions for Romosozumab -aqqg (EVENITY )

## 2024-11-27 NOTE — Telephone Encounter (Signed)
 Call from Chester at [facility] requesting clarification for Evenity  prescription. [phone]

## 2024-11-28 ENCOUNTER — Ambulatory Visit (HOSPITAL_COMMUNITY)
Admission: RE | Admit: 2024-11-28 | Discharge: 2024-11-28 | Disposition: A | Discharge status: Home / Self Care | Source: Ambulatory Visit | Attending: Nurse Practitioner | Admitting: Nurse Practitioner

## 2024-11-28 VITALS — BP 118/58 | HR 67 | Temp 97.7°F | Resp 16

## 2024-11-28 DIAGNOSIS — M810 Age-related osteoporosis without current pathological fracture: Secondary | ICD-10-CM | POA: Insufficient documentation

## 2024-11-28 MED ORDER — ROMOSOZUMAB-AQQG 105 MG/1.17ML ~~LOC~~ SOSY
PREFILLED_SYRINGE | SUBCUTANEOUS | Status: AC
  Filled 2024-11-28: qty 1.17

## 2024-11-28 MED ORDER — ROMOSOZUMAB-AQQG 105 MG/1.17ML ~~LOC~~ SOSY
210.0000 mg | PREFILLED_SYRINGE | Freq: Once | SUBCUTANEOUS | Status: AC
  Administered 2024-11-28: 210 mg via SUBCUTANEOUS

## 2024-12-29 ENCOUNTER — Encounter (HOSPITAL_COMMUNITY)

## 2025-01-19 ENCOUNTER — Ambulatory Visit: Admitting: Oncology

## 2025-01-19 ENCOUNTER — Other Ambulatory Visit

## 2025-05-05 ENCOUNTER — Ambulatory Visit: Payer: Self-pay

## 2025-11-24 ENCOUNTER — Encounter: Admitting: Nurse Practitioner
